# Patient Record
Sex: Female | Born: 1985
Health system: Southern US, Community
[De-identification: ages and names within clinical notes are randomized; demographics above are authoritative.]

## PROBLEM LIST (undated history)

## (undated) DIAGNOSIS — K649 Unspecified hemorrhoids: Secondary | ICD-10-CM

## (undated) DIAGNOSIS — K30 Functional dyspepsia: Secondary | ICD-10-CM

## (undated) DIAGNOSIS — E221 Hyperprolactinemia: Secondary | ICD-10-CM

## (undated) DIAGNOSIS — R519 Headache, unspecified: Secondary | ICD-10-CM

## (undated) DIAGNOSIS — N83209 Unspecified ovarian cyst, unspecified side: Secondary | ICD-10-CM

## (undated) DIAGNOSIS — R002 Palpitations: Secondary | ICD-10-CM

## (undated) DIAGNOSIS — I499 Cardiac arrhythmia, unspecified: Secondary | ICD-10-CM

## (undated) DIAGNOSIS — E282 Polycystic ovarian syndrome: Secondary | ICD-10-CM

## (undated) DIAGNOSIS — R51 Headache: Secondary | ICD-10-CM

## (undated) DIAGNOSIS — D352 Benign neoplasm of pituitary gland: Secondary | ICD-10-CM

## (undated) DIAGNOSIS — F419 Anxiety disorder, unspecified: Secondary | ICD-10-CM

## (undated) HISTORY — DX: Unspecified ovarian cyst, unspecified side: N83.209

## (undated) HISTORY — DX: Polycystic ovarian syndrome: E28.2

## (undated) HISTORY — DX: Benign neoplasm of pituitary gland: D35.2

## (undated) HISTORY — DX: Headache, unspecified: R51.9

## (undated) HISTORY — PX: NO PAST SURGERIES: SHX2092

## (undated) HISTORY — DX: Hyperprolactinemia: E22.1

## (undated) HISTORY — DX: Unspecified hemorrhoids: K64.9

## (undated) HISTORY — DX: Cardiac arrhythmia, unspecified: I49.9

## (undated) HISTORY — DX: Anxiety disorder, unspecified: F41.9

## (undated) HISTORY — DX: Functional dyspepsia: K30

## (undated) HISTORY — DX: Headache: R51

## (undated) HISTORY — DX: Palpitations: R00.2

---
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
## 2007-07-15 ENCOUNTER — Emergency Department: Payer: Self-pay | Admitting: Emergency Medicine

## 2011-04-09 ENCOUNTER — Ambulatory Visit: Payer: Self-pay

## 2011-07-03 ENCOUNTER — Ambulatory Visit: Payer: Self-pay

## 2012-06-16 ENCOUNTER — Ambulatory Visit: Payer: Self-pay

## 2014-04-05 DIAGNOSIS — D352 Benign neoplasm of pituitary gland: Secondary | ICD-10-CM | POA: Insufficient documentation

## 2014-04-05 DIAGNOSIS — N915 Oligomenorrhea, unspecified: Secondary | ICD-10-CM | POA: Insufficient documentation

## 2015-07-14 ENCOUNTER — Ambulatory Visit (INDEPENDENT_AMBULATORY_CARE_PROVIDER_SITE_OTHER): Payer: Self-pay

## 2015-07-14 DIAGNOSIS — Z23 Encounter for immunization: Secondary | ICD-10-CM

## 2016-04-22 ENCOUNTER — Encounter: Payer: Self-pay | Admitting: *Deleted

## 2016-04-22 ENCOUNTER — Telehealth: Payer: Self-pay | Admitting: Cardiology

## 2016-04-22 ENCOUNTER — Emergency Department
Admission: EM | Admit: 2016-04-22 | Discharge: 2016-04-22 | Disposition: A | Discharge status: Home / Self Care | Payer: Self-pay | Attending: Emergency Medicine | Admitting: Emergency Medicine

## 2016-04-22 DIAGNOSIS — Z79899 Other long term (current) drug therapy: Secondary | ICD-10-CM | POA: Insufficient documentation

## 2016-04-22 DIAGNOSIS — R002 Palpitations: Secondary | ICD-10-CM | POA: Insufficient documentation

## 2016-04-22 LAB — CBC
HCT: 39.9 % (ref 35.0–47.0)
Hemoglobin: 14 g/dL (ref 12.0–16.0)
MCH: 31.3 pg (ref 26.0–34.0)
MCHC: 35.1 g/dL (ref 32.0–36.0)
MCV: 89 fL (ref 80.0–100.0)
PLATELETS: 145 10*3/uL — AB (ref 150–440)
RBC: 4.48 MIL/uL (ref 3.80–5.20)
RDW: 12.9 % (ref 11.5–14.5)
WBC: 7.3 10*3/uL (ref 3.6–11.0)

## 2016-04-22 LAB — COMPREHENSIVE METABOLIC PANEL
ALK PHOS: 37 U/L — AB (ref 38–126)
ALT: 11 U/L — ABNORMAL LOW (ref 14–54)
ANION GAP: 8 (ref 5–15)
AST: 16 U/L (ref 15–41)
Albumin: 4.2 g/dL (ref 3.5–5.0)
BUN: 12 mg/dL (ref 6–20)
CALCIUM: 9.2 mg/dL (ref 8.9–10.3)
CHLORIDE: 105 mmol/L (ref 101–111)
CO2: 23 mmol/L (ref 22–32)
CREATININE: 0.67 mg/dL (ref 0.44–1.00)
Glucose, Bld: 97 mg/dL (ref 65–99)
Potassium: 3.5 mmol/L (ref 3.5–5.1)
SODIUM: 136 mmol/L (ref 135–145)
Total Bilirubin: 0.9 mg/dL (ref 0.3–1.2)
Total Protein: 7.2 g/dL (ref 6.5–8.1)

## 2016-04-22 LAB — TSH: TSH: 1.475 u[IU]/mL (ref 0.350–4.500)

## 2016-04-22 LAB — TROPONIN I

## 2016-04-22 MED ORDER — SODIUM CHLORIDE 0.9 % IV SOLN
1000.0000 mL | Freq: Once | INTRAVENOUS | Status: AC
  Administered 2016-04-22: 1000 mL via INTRAVENOUS

## 2016-04-22 NOTE — Telephone Encounter (Signed)
Called patient to make ED fu from 04/22/16  Pt is coming 05/22/16 to see Dr Ellyn Hack.  Pt is going on cruise 05/05/16-05/11/16

## 2016-04-22 NOTE — ED Provider Notes (Signed)
[facility] Emergency Department Provider Note   ____________________________________________    I have reviewed the triage vital signs and the nursing notes.   HISTORY  Chief Complaint Palpitations   HPI Sherry Park is a 30 y.o. female who presents with complaints of fluttering in her chest. She denies chest pain but reports sometimes she feels her heart going rapidly. She denies recent travel. No calf pain or swelling. No shortness of breath. No pleurisy. She is treated by endocrinology for a prolactinoma for which she takes Dostinex. She denies fevers or chills. No rash. She has felt this way intermittently over the last month.   Past Medical History  Diagnosis Date  . Pituitary tumor ([place])     There are no active problems to display for this patient.   History reviewed. No pertinent past surgical history.  Current Outpatient Rx  Name  Route  Sig  Dispense  Refill  . cabergoline (DOSTINEX) 0.5 MG tablet   Oral   Take 1 tablet by mouth 2 (two) times a week.           Allergies Review of patient's allergies indicates no known allergies.  History reviewed. No pertinent family history.  Social History Social History  Substance Use Topics  . Smoking status: Never Smoker   . Smokeless tobacco: None  . Alcohol Use: None    Review of Systems  Constitutional: No fever/chills Eyes: No visual changes.  ENT: No sore throat. Cardiovascular: Denies chest pain. Palpitations as above Respiratory: Denies shortness of breath. Gastrointestinal: No abdominal pain.   Genitourinary: Negative for dysuria. Musculoskeletal: Negative for back pain. Skin: Negative for rash. Neurological: Negative for headaches or weakness  10-point ROS otherwise negative.  ____________________________________________   PHYSICAL EXAM:  VITAL SIGNS: ED Triage Vitals  Enc Vitals Group     BP 04/22/16 0933 142/92 mmHg     Pulse Rate 04/22/16 0933 118       Resp 04/22/16 0933 18     Temp 04/22/16 0933 98.2 F (36.8 C)     Temp Source 04/22/16 0933 Oral     SpO2 04/22/16 0933 100 %     Weight 04/22/16 0933 146 lb (66.225 kg)     Height 04/22/16 0933 5\' 4"  (1.626 m)     Head Cir --      Peak Flow --      Pain Score --      Pain Loc --      Pain Edu? --      Excl. in [place]? --     Constitutional: Alert and oriented. No acute distress. Pleasant and interactive Eyes: Conjunctivae are normal.  Head: Atraumatic.Normocephalic Nose: No congestion/rhinnorhea. Mouth/Throat: Mucous membranes are moist.  Neck: Painless ROM Cardiovascular: Mild tachycardia, regular rhythm. Grossly normal heart sounds.  No murmurs Good peripheral circulation. Respiratory: Normal respiratory effort.  No retractions. Lungs CTAB. Gastrointestinal: Soft and nontender. No distention.  No CVA tenderness. Genitourinary: deferred Musculoskeletal: No lower extremity tenderness nor edema.  Warm and well perfused Neurologic:  Normal speech and language. No gross focal neurologic deficits are appreciated.  Skin:  Skin is warm, dry and intact. No rash noted. Psychiatric: Mood and affect are normal. Speech and behavior are normal.  ____________________________________________   LABS (all labs ordered are listed, but only abnormal results are displayed)  Labs Reviewed  CBC - Abnormal; Notable for the following:    Platelets 145 (*)    All other components within normal limits  COMPREHENSIVE METABOLIC PANEL - Abnormal; Notable for the following:    ALT 11 (*)    Alkaline Phosphatase 37 (*)    All other components within normal limits  TROPONIN I  TSH   ____________________________________________  EKG  ED ECG REPORT I, Lavonia Drafts, the attending physician, personally viewed and interpreted this ECG.  Date: 04/22/2016 EKG Time: 9:34 AM Rate: 108 Rhythm: Sinus tachycardia QRS Axis: normal Intervals: normal ST/T Wave abnormalities: normal Conduction  Disturbances: none Narrative Interpretation: unremarkable  ____________________________________________  RADIOLOGY  None ____________________________________________   PROCEDURES  Procedure(s) performed: No    Critical Care performed: No ____________________________________________   INITIAL IMPRESSION / ASSESSMENT AND PLAN / ED COURSE  Pertinent labs & imaging results that were available during my care of the patient were reviewed by me and considered in my medical decision making (see chart for details).  Discussed with her endocrinologist, she does not feel Dostinex is responsible for tachycardia. She does note it can cause heart valve issues but the patient has no murmurs.  ----------------------------------------- 1:31 PM on 04/22/2016 -----------------------------------------  Patient's heart rate has been normal for some time. Thus far her lab work is unremarkable. We are awaiting TSH ____________________________________________ TSH is normal. Patient's heart rate is normal. She is provided for discharge with cardiology follow-up. Return precautions discussed  FINAL CLINICAL IMPRESSION(S) / ED DIAGNOSES  Final diagnoses:  Palpitations      NEW MEDICATIONS STARTED DURING THIS VISIT:  Discharge Medication List as of 04/22/2016  1:46 PM       Note:  This document was prepared using Dragon voice recognition software and may include unintentional dictation errors.    Lavonia Drafts, MD 04/22/16 1435

## 2016-04-22 NOTE — ED Notes (Addendum)
States she feels as if her heart is fluttering since Thursday, denies any chest pain or SOB, denies any cardiac hx, pt is currently on Cabergoline  for tx of pitutary tumor

## 2016-04-22 NOTE — Discharge Instructions (Signed)

## 2016-04-23 ENCOUNTER — Ambulatory Visit: Payer: Self-pay | Admitting: Unknown Physician Specialty

## 2016-04-24 ENCOUNTER — Ambulatory Visit (INDEPENDENT_AMBULATORY_CARE_PROVIDER_SITE_OTHER): Payer: Self-pay | Admitting: Physician Assistant

## 2016-04-24 ENCOUNTER — Encounter: Payer: Self-pay | Admitting: Physician Assistant

## 2016-04-24 VITALS — BP 122/78 | HR 98 | Ht 64.0 in | Wt 144.5 lb

## 2016-04-24 DIAGNOSIS — Z5181 Encounter for therapeutic drug level monitoring: Secondary | ICD-10-CM

## 2016-04-24 DIAGNOSIS — F419 Anxiety disorder, unspecified: Secondary | ICD-10-CM

## 2016-04-24 DIAGNOSIS — D352 Benign neoplasm of pituitary gland: Secondary | ICD-10-CM

## 2016-04-24 DIAGNOSIS — R002 Palpitations: Secondary | ICD-10-CM

## 2016-04-24 NOTE — Progress Notes (Signed)
Cardiology Office Note Date:  04/24/2016  Patient ID:  Sherry Park 10-Aug-1986, MRN [id number] PCP:  Kathrine Haddock, NP  Cardiologist:  New to [facility] - Dr. Fletcher Anon, MD    Chief Complaint: ED follow up for palpitations   History of Present Illness: Sherry Park is a 30 y.o. female with history of microprolactinoma followed by endocrinology on Dostinex with most recent prolactin level found to be suppressed, prior episode of palpitations in 2005, and anxiety who presents for ED follow up of palpitations. She does not have prior known cardiac history who presented to the [facility] ED on 04/22/16 with complaints of worsening palpitaitons since 04/18/16. She was previously seen in 2005 for palpitations, though note is not on file in Epic and there is no data to review from that time. She denied any chest pain or SOB. Work up in the ED showed a normal TSH, unremarkable CBC outside of PLT 145, troponin negative x 1, SCr 0.67, K+ 3.5. No imaging was performed in the ED. EKG showed sinus tachycardia, 108 bpm, no acute st/t changes. Never a smoker. Case with discussed with endocrinology in the ED who felt her Dostinex was not an etiology of her sinus tachycardia (tachycardia is not a known adverse effect of Dostinex). Her heart rate improved to "normal" in the ED. Her hypokalemia was not corrected in the ED. Of note, she noted dizziness of uncertain etiology at her last endocrinology visit in May 2017, with the consideration of changing from cabergoline to bromocriptine and a possible repeat MRI.    She comes in with complaint of intermittent palpitations over the past 6 months that only occur at rest and are worse at nighttime when she is trying to fall asleep. Since 04/18/16 she has noticed multiple episodes daily, that too only occur at rest. Sometimes they are worse after eating. She reports when she starts to think about her palpitations she will become anxious, then the palpitations will appear. She  states if she is not anxious she will not have any sensation of her heart beat. She previously drank 1 cup of coffee and 2 black teas daily, though has cut out all caffeine since 04/18/16. She denies any illegal drug abuse ever. She has never been a smoker and denies alcohol use. She has not had any recent illness, including N/V/D. She reports when she eats bananas she does not notice the palpitations. She has never had an echo and no prior stress test, cardiac cath, or heart monitor. She currently reports a "fast heart beat." It was noted she had a heart rate of 98 bpm at that time, in normal sinus rhythm without any evidence of PACs, PVCs, or arrhythmia.     Past Medical History  Diagnosis Date  . Microprolactinoma ([place])     a. on Dostinex; b. followed by Dr. Gabriel Carina, MD  . Headache     a. felt to be 2/2 Dostinex    History reviewed. No pertinent past surgical history.  Current Outpatient Prescriptions  Medication Sig Dispense Refill  . cabergoline (DOSTINEX) 0.5 MG tablet Take 1 tablet by mouth 2 (two) times a week.    . norethindrone (MICRONOR,CAMILA,ERRIN) 0.35 MG tablet Take 1 tablet by mouth daily.      No current facility-administered medications for this visit.    Allergies:   Review of patient's allergies indicates no known allergies.   Social History:  The patient  reports that she has never smoked. She does not have any  smokeless tobacco history on file. She reports that she does not drink alcohol or use illicit drugs.   Family History:  The patient's family history includes Diabetes Mellitus II in her mother; Hypertension in her mother; Stroke in her mother.  ROS:   Review of Systems  Constitutional: Positive for malaise/fatigue. Negative for fever, chills, weight loss and diaphoresis.  HENT: Negative for congestion.   Eyes: Negative for discharge and redness.  Respiratory: Negative for cough, sputum production, shortness of breath and wheezing.   Cardiovascular: Positive  for palpitations. Negative for chest pain, orthopnea, claudication, leg swelling and PND.  Gastrointestinal: Negative for heartburn, nausea, vomiting, abdominal pain and diarrhea.  Musculoskeletal: Negative for myalgias and falls.  Skin: Negative for rash.  Neurological: Negative for dizziness, tingling, tremors, sensory change, speech change, focal weakness, loss of consciousness and weakness.  Endo/Heme/Allergies: Does not bruise/bleed easily.  Psychiatric/Behavioral: Negative for substance abuse. The patient is nervous/anxious.   All other systems reviewed and are negative.    PHYSICAL EXAM:  VS:  BP 122/78 mmHg  Pulse 98  Ht 5\' 4"  (1.626 m)  Wt 144 lb 8 oz (65.545 kg)  BMI 24.79 kg/m2  LMP 03/19/2016 BMI: Body mass index is 24.79 kg/(m^2).  Physical Exam  Constitutional: She is oriented to person, place, and time. She appears well-developed and well-nourished.  HENT:  Head: Normocephalic and atraumatic.  Eyes: Right eye exhibits no discharge. Left eye exhibits no discharge.  Neck: Normal range of motion. No JVD present.  Cardiovascular: Normal rate, regular rhythm, S1 normal, S2 normal and normal heart sounds.  Exam reveals no distant heart sounds, no friction rub, no midsystolic click and no opening snap.   No murmur heard. Pulmonary/Chest: Effort normal and breath sounds normal. No respiratory distress. She has no decreased breath sounds. She has no wheezes. She has no rales. She exhibits no tenderness.  Abdominal: Soft. She exhibits no distension. There is no tenderness.  Musculoskeletal: She exhibits no edema.  Neurological: She is alert and oriented to person, place, and time.  Skin: Skin is warm and dry. No cyanosis. Nails show no clubbing.  Psychiatric: She has a normal mood and affect. Her speech is normal and behavior is normal. Judgment and thought content normal.     EKG:  Was ordered and interpreted by me today. Shows NSR, 98 bpm, mildly prolonged QTc of 480 msec,  no acute st/t changes  Recent Labs: 04/22/2016: ALT 11*; BUN 12; Creatinine, Ser 0.67; Hemoglobin 14.0; Platelets 145*; Potassium 3.5; Sodium 136; TSH 1.475  No results found for requested labs within last 365 days.   Estimated Creatinine Clearance: 90.4 mL/min (by C-G formula based on Cr of 0.67).   Wt Readings from Last 3 Encounters:  04/24/16 144 lb 8 oz (65.545 kg)  04/22/16 146 lb (66.225 kg)     Other studies reviewed: Additional studies/records reviewed today include: summarized above  ASSESSMENT AND PLAN:  1. Palpitations: Only occuring in the setting of rest, never with exertion. Worse at nighttime and when anxious. She was noted to be hypokalemic in the ED at 3.5. Perhaps her hypokalemia is playing a role in her symptoms, though suspect there is a strong component of anxiety in play with her symptoms. She self admits anxiety as well. Will recheck a bmet and check a magnesium today. If her potassium remains low will provide low-dose KCl with a recheck bmet in 1 week. She will wear a 48-hour Holter monitor. Her symptoms are frequent enough that she should  not need a longer monitor to evaluate for arrhythmia or ectopy. She is noted to still be symptomatic in the office today with a heart rate in the mid to upper 90's bpm in sinus rhythm. Given this along with her anxiety, she may benefit from a low-dose beta blocker pending the above evaluation.   2. Microprolactinoma: On Dostinex for several years. No prior baseline echo at treatment initiation. Check echo. If she is to remain on the medication for long term she will require periodic echocardiograms every 6-12 months per guidelines. Management of her prolactinoma per endocrinology.  3. Anxiety: Possible beta blocker as above. She may benefit from further treatment per PCP with possible SSRI.    Disposition: F/u with myself or Dr. Fletcher Anon, MD in 2 weeks  Current medicines are reviewed at length with the patient today.  The patient did  not have any concerns regarding medicines.  Melvern Banker PA-C 04/24/2016 4:21 PM     [address] [place], [place] [address] [phone]

## 2016-04-24 NOTE — Patient Instructions (Addendum)
Medication Instructions:  Your physician recommends that you continue on your current medications as directed. Please refer to the Current Medication list given to you today.   Labwork: BMET, mag  Testing/Procedures: Your physician has requested that you have an echocardiogram. Echocardiography is a painless test that uses sound waves to create images of your heart. It provides your doctor with information about the size and shape of your heart and how well your heart's chambers and valves are working. This procedure takes approximately one hour. There are no restrictions for this procedure.  Your physician has recommended that you wear a holter monitor. Holter monitors are medical devices that record the heart's electrical activity. Doctors most often use these monitors to diagnose arrhythmias. Arrhythmias are problems with the speed or rhythm of the heartbeat. The monitor is a small, portable device. You can wear one while you do your normal daily activities. This is usually used to diagnose what is causing palpitations/syncope (passing out).    Follow-Up: Your physician recommends that you schedule a follow-up appointment prior to July 30.    Any Other Special Instructions Will Be Listed Below (If Applicable).     If you need a refill on your cardiac medications before your next appointment, please call your pharmacy.  Echocardiogram An echocardiogram, or echocardiography, uses sound waves (ultrasound) to produce an image of your heart. The echocardiogram is simple, painless, obtained within a short period of time, and offers valuable information to your health care provider. The images from an echocardiogram can provide information such as:  Evidence of coronary artery disease (CAD).  Heart size.  Heart muscle function.  Heart valve function.  Aneurysm detection.  Evidence of a past heart attack.  Fluid buildup around the heart.  Heart muscle thickening.  Assess heart  valve function. LET [facility] CARE PROVIDER KNOW ABOUT:  Any allergies you have.  All medicines you are taking, including vitamins, herbs, eye drops, creams, and over-the-counter medicines.  Previous problems you or members of your family have had with the use of anesthetics.  Any blood disorders you have.  Previous surgeries you have had.  Medical conditions you have.  Possibility of pregnancy, if this applies. BEFORE THE PROCEDURE  No special preparation is needed. Eat and drink normally.  PROCEDURE   In order to produce an image of your heart, gel will be applied to your chest and a wand-like tool (transducer) will be moved over your chest. The gel will help transmit the sound waves from the transducer. The sound waves will harmlessly bounce off your heart to allow the heart images to be captured in real-time motion. These images will then be recorded.  You may need an IV to receive a medicine that improves the quality of the pictures. AFTER THE PROCEDURE You may return to your normal schedule including diet, activities, and medicines, unless your health care provider tells you otherwise.   This information is not intended to replace advice given to you by your health care provider. Make sure you discuss any questions you have with your health care provider.   Document Released: 09/20/2000 Document Revised: 10/14/2014 Document Reviewed: 05/31/2013 Elsevier Interactive Patient Education 2016 Elsevier Inc. Holter Monitoring A Holter monitor is a small device that is used to detect abnormal heart rhythms. It clips to your clothing and is connected by wires to flat, sticky disks (electrodes) that attach to your chest. It is worn continuously for 24-48 hours. HOME CARE INSTRUCTIONS  Wear your Holter monitor at all  times, even while exercising and sleeping, for as long as directed by your health care provider.  Make sure that the Holter monitor is safely clipped to your clothing  or close to your body as recommended by your health care provider.  Do not get the monitor or wires wet.  Do not put body lotion or moisturizer on your chest.  Keep your skin clean.  Keep a diary of your daily activities, such as walking and doing chores. If you feel that your heartbeat is abnormal or that your heart is fluttering or skipping a beat:  Record what you are doing when it happens.  Record what time of day the symptoms occur.  Return your Holter monitor as directed by your health care provider.  Keep all follow-up visits as directed by your health care provider. This is important. SEEK IMMEDIATE MEDICAL CARE IF:  You feel lightheaded or you faint.  You have trouble breathing.  You feel pain in your chest, upper arm, or jaw.  You feel sick to your stomach and your skin is pale, cool, or damp.  You heartbeat feels unusual or abnormal.   This information is not intended to replace advice given to you by your health care provider. Make sure you discuss any questions you have with your health care provider.   Document Released: 06/21/2004 Document Revised: 10/14/2014 Document Reviewed: 05/02/2014 Elsevier Interactive Patient Education [employer].

## 2016-04-25 LAB — BASIC METABOLIC PANEL
BUN / CREAT RATIO: 12 (ref 9–23)
BUN: 8 mg/dL (ref 6–20)
CO2: 21 mmol/L (ref 18–29)
Calcium: 9.8 mg/dL (ref 8.7–10.2)
Chloride: 104 mmol/L (ref 96–106)
Creatinine, Ser: 0.68 mg/dL (ref 0.57–1.00)
GFR calc non Af Amer: 119 mL/min/{1.73_m2} (ref >59)
GFR, EST AFRICAN AMERICAN: 138 mL/min/{1.73_m2} (ref >59)
GLUCOSE: 84 mg/dL (ref 65–99)
Potassium: 3.7 mmol/L (ref 3.5–5.2)
Sodium: 144 mmol/L (ref 134–144)

## 2016-04-25 LAB — MAGNESIUM: Magnesium: 2.1 mg/dL (ref 1.6–2.3)

## 2016-04-29 ENCOUNTER — Ambulatory Visit (INDEPENDENT_AMBULATORY_CARE_PROVIDER_SITE_OTHER): Payer: Self-pay

## 2016-04-29 ENCOUNTER — Other Ambulatory Visit: Payer: Self-pay

## 2016-04-29 DIAGNOSIS — Z5181 Encounter for therapeutic drug level monitoring: Secondary | ICD-10-CM

## 2016-04-29 DIAGNOSIS — R002 Palpitations: Secondary | ICD-10-CM

## 2016-05-03 ENCOUNTER — Ambulatory Visit (INDEPENDENT_AMBULATORY_CARE_PROVIDER_SITE_OTHER): Payer: Self-pay | Admitting: Cardiovascular Disease

## 2016-05-03 ENCOUNTER — Encounter: Payer: Self-pay | Admitting: Cardiovascular Disease

## 2016-05-03 VITALS — BP 120/80 | HR 102 | Ht 64.0 in | Wt 139.5 lb

## 2016-05-03 DIAGNOSIS — R42 Dizziness and giddiness: Secondary | ICD-10-CM

## 2016-05-03 DIAGNOSIS — R002 Palpitations: Secondary | ICD-10-CM

## 2016-05-03 NOTE — Patient Instructions (Signed)
Medication Instructions:  Your physician recommends that you continue on your current medications as directed. Please refer to the Current Medication list given to you today.   Labwork: none  Testing/Procedures: none  Follow-Up: Your physician wants you to follow-up in: 3 months with Christell Faith, PA-C You will receive a reminder letter in the mail two months in advance. If you don't receive a letter, please call our office to schedule the follow-up appointment.   Any Other Special Instructions Will Be Listed Below (If Applicable).     If you need a refill on your cardiac medications before your next appointment, please call your pharmacy.

## 2016-05-03 NOTE — Progress Notes (Signed)
Cardiology Office Note   Date:  05/03/2016   ID:  Sherry Park, DOB 03-31-1986, MRN [id number]  PCP:  Kathrine Haddock, NP  Cardiologist:   Kathlyn Sacramento, MD   Chief Complaint  Patient presents with  . Other    Follow up from echo. Meds reviewed by the patient verbally. Pt. c/o more frequent palpitations than before.       History of Present Illness: Sherry Park is a 30 y.o. female who was referred by Dr. Corky Downs  from [facility] ED for evaluation of palpitations. She is treated by endocrinology for a prolactinoma for which she takes Dostinex.  workup in the emergency department showed normal labs overall including magnesium and TSH. Troponin was normal. EKG showed sinus tachycardia.  she underwent an echocardiogram which was overall normal. She was asked to increase her potassium intake given that her platelet level was borderline at 3.5. She has been eating a banana every day and reports improvement in symptoms. She complains mainly of skipped beats at rest when she is trying to sleep or after she eats. She has no exertional palpitations. She has no family history of coronary artery disease, arrhythmia or sudden death.  Past Medical History:  Diagnosis Date  . Headache    a. felt to be 2/2 Dostinex  . Microprolactinoma ([place])    a. on Dostinex; b. followed by Dr. Gabriel Carina, MD    History reviewed. No pertinent surgical history.   Current Outpatient Prescriptions  Medication Sig Dispense Refill  . cabergoline (DOSTINEX) 0.5 MG tablet Take 1 tablet by mouth 2 (two) times a week.    . norethindrone (MICRONOR,CAMILA,ERRIN) 0.35 MG tablet Take 1 tablet by mouth daily.      No current facility-administered medications for this visit.     Allergies:   Review of patient's allergies indicates no known allergies.    Social History:  The patient  reports that she has never smoked. She has never used smokeless tobacco. She reports that she does not drink alcohol or use drugs.    Family History:  The patient's family history includes Diabetes Mellitus II in her mother; Hypertension in her mother; Stroke in her mother.    ROS:  Please see the history of present illness.   Otherwise, review of systems are positive for none.   All other systems are reviewed and negative.    PHYSICAL EXAM: VS:  BP 120/80 (BP Location: Left Arm, Patient Position: Sitting, Cuff Size: Normal)   Pulse (!) 102   Ht 5\' 4"  (1.626 m)   Wt 139 lb 8 oz (63.3 kg)   LMP 03/19/2016   BMI 23.95 kg/m  , BMI Body mass index is 23.95 kg/m. GEN: Well nourished, well developed, in no acute distress  HEENT: normal  Neck: no JVD, carotid bruits, or masses Cardiac: RRR; no murmurs, rubs, or gallops,no edema  Respiratory:  clear to auscultation bilaterally, normal work of breathing GI: soft, nontender, nondistended, + BS MS: no deformity or atrophy  Skin: warm and dry, no rash Neuro:  Strength and sensation are intact Psych: euthymic mood, full affect   EKG:  EKG is ordered today. The ekg ordered today demonstrates normal sinus rhythm with no significant ST or T wave changes. Normal PR and QT intervals.   Recent Labs: 04/22/2016: ALT 11; Hemoglobin 14.0; Platelets 145; TSH 1.475 04/24/2016: BUN 8; Creatinine, Ser 0.68; Magnesium 2.1; Potassium 3.7; Sodium 144    Lipid Panel No results found for: CHOL, TRIG, HDL,  CHOLHDL, VLDL, LDLCALC, LDLDIRECT    Wt Readings from Last 3 Encounters:  05/03/16 139 lb 8 oz (63.3 kg)  04/24/16 144 lb 8 oz (65.5 kg)  04/22/16 146 lb (66.2 kg)      ASSESSMENT AND PLAN:  1.  Palpitations: The history is suggestive of premature beats. Echocardiogram was essentially normal and current EKG also does not show any abnormalities. I reassured her that the chance of malignant arrhythmias extremely low. Symptoms improved with increased potassium intake. She also cut down on caffeine intake. A Holter monitor was done but this has not been processed yet and I could  not treated. She tends to have sinus tachycardia and some of this might be triggered by stress and anxiety. I explained to them that treatment with a beta blocker can be considered as a last resort for symptomatic relief.    Disposition:   FU with Ryan in 3 months.   Signed,  Kathlyn Sacramento, MD  05/03/2016 3:36 PM    [place]

## 2016-05-08 ENCOUNTER — Ambulatory Visit
Admission: RE | Admit: 2016-05-08 | Discharge: 2016-05-08 | Disposition: A | Discharge status: Home / Self Care | Payer: Self-pay | Source: Ambulatory Visit | Attending: Physician Assistant | Admitting: Physician Assistant

## 2016-05-08 DIAGNOSIS — F410 Panic disorder [episodic paroxysmal anxiety] without agoraphobia: Secondary | ICD-10-CM | POA: Insufficient documentation

## 2016-05-08 DIAGNOSIS — I491 Atrial premature depolarization: Secondary | ICD-10-CM | POA: Insufficient documentation

## 2016-05-08 DIAGNOSIS — R002 Palpitations: Secondary | ICD-10-CM | POA: Insufficient documentation

## 2016-05-08 DIAGNOSIS — R000 Tachycardia, unspecified: Secondary | ICD-10-CM | POA: Insufficient documentation

## 2016-05-13 ENCOUNTER — Telehealth: Payer: Self-pay | Admitting: Cardiovascular Disease

## 2016-05-13 NOTE — Telephone Encounter (Signed)
Left message to call back  

## 2016-05-13 NOTE — Telephone Encounter (Signed)
Pt would like holter results. 

## 2016-05-14 NOTE — Telephone Encounter (Signed)
Per verbal from Dr. Fletcher Anon, HM shows occasional PACs, one episode sinus tach. Symptoms during this episode included "anxiety and a panic attack". Nothing serious. Continue to monitor.  Reviewed results w/pt who verbalized understanding and is appreciative of the call.

## 2016-05-15 ENCOUNTER — Telehealth: Payer: Self-pay | Admitting: Cardiovascular Disease

## 2016-05-15 ENCOUNTER — Ambulatory Visit: Payer: Self-pay | Admitting: Cardiology

## 2016-05-15 NOTE — Telephone Encounter (Signed)
Pt wanting to talk to someone since her heart has been skipping She has had a lot more light headedness She spoke to a nurse yesterday about Holter results this is a fu question from that Please advise.

## 2016-05-16 NOTE — Telephone Encounter (Signed)
Left message on machine for patient to contact the office.   

## 2016-05-16 NOTE — Telephone Encounter (Signed)
Patient returning vm.  Please call again.

## 2016-05-17 NOTE — Telephone Encounter (Addendum)
Left message on machine for patient to contact the office.   

## 2016-05-17 NOTE — Telephone Encounter (Signed)
S/w pt who inquires as to possible cause of recent light-headedness. States it sometimes occurs when she stands from sitting position and does not think it is r/t any BP issue as her BP is WNL. Feels it could be r/t stress.  Pt had a recent echo, holter monitor, and labs. No intervention needed. Advised pt to continue to monitor sx and BP and f/u w/PCP. She verbalized understanding and is agreeable w/plan.  She has f/u appt w/Dr. Fletcher Anon in Oct.

## 2016-05-22 ENCOUNTER — Ambulatory Visit: Payer: Self-pay | Admitting: Cardiology

## 2016-05-29 ENCOUNTER — Ambulatory Visit: Payer: Self-pay | Admitting: Unknown Physician Specialty

## 2016-06-06 ENCOUNTER — Encounter: Payer: Self-pay | Admitting: Family Medicine

## 2016-06-06 ENCOUNTER — Ambulatory Visit (INDEPENDENT_AMBULATORY_CARE_PROVIDER_SITE_OTHER): Payer: Self-pay | Admitting: Family Medicine

## 2016-06-06 VITALS — BP 123/81 | HR 82 | Temp 98.2°F | Ht 63.8 in | Wt 140.6 lb

## 2016-06-06 DIAGNOSIS — R42 Dizziness and giddiness: Secondary | ICD-10-CM

## 2016-06-06 DIAGNOSIS — F411 Generalized anxiety disorder: Secondary | ICD-10-CM | POA: Insufficient documentation

## 2016-06-06 MED ORDER — FLUTICASONE PROPIONATE 50 MCG/ACT NA SUSP: 2.0000 | Freq: Every day | NASAL | 6 refills | Status: DC

## 2016-06-06 MED ORDER — MECLIZINE HCL 12.5 MG PO TABS: 12.5000 mg | ORAL_TABLET | Freq: Three times a day (TID) | ORAL | 0 refills | Status: DC | PRN

## 2016-06-06 NOTE — Assessment & Plan Note (Signed)
Likely the primary cause of her feelings of weakness and feelings of being off balance. Score 13 on GAD 7 today, unsure of baseline. Not ready to pursue counseling or medications at this time.

## 2016-06-06 NOTE — Progress Notes (Signed)
BP 123/81 (BP Location: Right Arm, Patient Position: Sitting, Cuff Size: Normal)   Pulse 82   Temp 98.2 F (36.8 C)   Ht 5' 3.8" (1.621 m)   Wt 140 lb 9.6 oz (63.8 kg)   LMP 05/09/2016 (Approximate)   SpO2 99%   BMI 24.29 kg/m    Subjective:    Patient ID: Sherry Park, female    DOB: 1986-05-18, 30 y.o.   MRN: [id number]  HPI: Sherry Park is a 30 y.o. female  Chief Complaint  Patient presents with  . muscle weakness    pt states she has been having muscle weakness for about 2 months  . Anxiety  . balance    pt states she has been off balance for about 2 months now    Approximately 2 month history of generalized weakness and feeling off balance. States she went to the ER recently for palpitations and weakness, workup negative but sent to Cardiology for further eval. Cardiology work-up negative. Hx of microprolactinoma followed by endocrinology - worth mentioning that she mentioned these symptoms at visit in may and there was consideration of switching from cabergoline to bromocriptine and possibly repeating MRI to monitor for changes.  Does have a hx of anxiety that has been under good control until recently per patient. Has not ever taken medication for symptoms and is not interested in taking any at this time. Patient very tearful when discussing her symptoms. Also states she stopped her OCPs recently when she heard that they can cause brain tumors. Does not believe that she could be pregnant.  Denies fever, chills, tinnitus, ear pain, hearing or vision changes, headaches.   GAD 7 : Generalized Anxiety Score 06/06/2016  Nervous, Anxious, on Edge 2  Control/stop worrying 2  Worry too much - different things 2  Trouble relaxing 2  Restless 2  Easily annoyed or irritable 1  Afraid - awful might happen 2  Total GAD 7 Score 13  Anxiety Difficulty Somewhat difficult    Relevant past medical, surgical, family and social history reviewed and updated as indicated. Interim  medical history since our last visit reviewed. Allergies and medications reviewed and updated.  Review of Systems  HENT: Negative.   Respiratory: Negative.   Cardiovascular: Negative.   Gastrointestinal: Positive for nausea.  Musculoskeletal: Negative.   Neurological: Positive for weakness and light-headedness.  Psychiatric/Behavioral: The patient is nervous/anxious.     Per HPI unless specifically indicated above     Objective:    BP 123/81 (BP Location: Right Arm, Patient Position: Sitting, Cuff Size: Normal)   Pulse 82   Temp 98.2 F (36.8 C)   Ht 5' 3.8" (1.621 m)   Wt 140 lb 9.6 oz (63.8 kg)   LMP 05/09/2016 (Approximate)   SpO2 99%   BMI 24.29 kg/m   Wt Readings from Last 3 Encounters:  06/06/16 140 lb 9.6 oz (63.8 kg)  05/03/16 139 lb 8 oz (63.3 kg)  04/24/16 144 lb 8 oz (65.5 kg)    Physical Exam  Constitutional: She is oriented to person, place, and time. She appears well-developed and well-nourished. No distress.  HENT:  Head: Atraumatic.  Eyes: Conjunctivae are normal. Pupils are equal, round, and reactive to light. No scleral icterus.  Neck: Normal range of motion. Neck supple.  Cardiovascular: Normal rate, regular rhythm and normal heart sounds.   Musculoskeletal: Normal range of motion.  Lymphadenopathy:    She has no cervical adenopathy.  Neurological: She is alert and oriented  to person, place, and time. She displays normal reflexes. No cranial nerve deficit. Coordination normal.  Skin: Skin is warm and dry. No rash noted.  Psychiatric: She has a normal mood and affect. Her behavior is normal.  Tearful, shaky and anxious  Nursing note and vitals reviewed.       Assessment & Plan:   Problem List Items Addressed This Visit      Other   Generalized anxiety disorder    Likely the primary cause of her feelings of weakness and feelings of being off balance. Score 13 on GAD 7 today, unsure of baseline. Not ready to pursue counseling or medications at  this time.        Other Visit Diagnoses    Dizziness    -  Primary   All previous work-ups negative. Will send flonase and meclizine in case any vertigo/inner ear component but likely largely due to her anxiety acutely worsening   Relevant Orders   CBC with Differential/Platelet   Comprehensive metabolic panel   UA/M w/rflx Culture, Routine (Completed)   VITAMIN D 25 Hydroxy (Vit-D Deficiency, Fractures)   B12   Pregnancy, urine    Will await lab results. She will follow up if no resolution on new regimen.    Follow up plan: Return if symptoms worsen or fail to improve.

## 2016-06-07 ENCOUNTER — Encounter: Payer: Self-pay | Admitting: Family Medicine

## 2016-06-07 LAB — UA/M W/RFLX CULTURE, ROUTINE
BILIRUBIN UA: NEGATIVE
GLUCOSE, UA: NEGATIVE
KETONES UA: NEGATIVE
Nitrite, UA: NEGATIVE
PROTEIN UA: NEGATIVE
SPEC GRAV UA: 1.01 (ref 1.005–1.030)
Urobilinogen, Ur: 0.2 mg/dL (ref 0.2–1.0)
pH, UA: 7 (ref 5.0–7.5)

## 2016-06-07 LAB — COMPREHENSIVE METABOLIC PANEL
A/G RATIO: 1.7 (ref 1.2–2.2)
ALBUMIN: 4.6 g/dL (ref 3.5–5.5)
ALK PHOS: 43 IU/L (ref 39–117)
ALT: 8 IU/L (ref 0–32)
AST: 11 IU/L (ref 0–40)
BILIRUBIN TOTAL: 0.6 mg/dL (ref 0.0–1.2)
BUN / CREAT RATIO: 13 (ref 9–23)
BUN: 9 mg/dL (ref 6–20)
CHLORIDE: 101 mmol/L (ref 96–106)
CO2: 21 mmol/L (ref 18–29)
Calcium: 9.6 mg/dL (ref 8.7–10.2)
Creatinine, Ser: 0.68 mg/dL (ref 0.57–1.00)
GFR calc Af Amer: 138 mL/min/{1.73_m2} (ref >59)
GFR calc non Af Amer: 119 mL/min/{1.73_m2} (ref >59)
GLOBULIN, TOTAL: 2.7 g/dL (ref 1.5–4.5)
Glucose: 96 mg/dL (ref 65–99)
POTASSIUM: 3.9 mmol/L (ref 3.5–5.2)
SODIUM: 141 mmol/L (ref 134–144)
Total Protein: 7.3 g/dL (ref 6.0–8.5)

## 2016-06-07 LAB — CBC WITH DIFFERENTIAL/PLATELET
Basophils Absolute: 0 10*3/uL (ref 0.0–0.2)
Basos: 1 %
EOS (ABSOLUTE): 0.1 10*3/uL (ref 0.0–0.4)
EOS: 1 %
HEMATOCRIT: 42 % (ref 34.0–46.6)
HEMOGLOBIN: 13.8 g/dL (ref 11.1–15.9)
Immature Grans (Abs): 0 10*3/uL (ref 0.0–0.1)
Immature Granulocytes: 0 %
LYMPHS ABS: 1.2 10*3/uL (ref 0.7–3.1)
Lymphs: 23 %
MCH: 30.5 pg (ref 26.6–33.0)
MCHC: 32.9 g/dL (ref 31.5–35.7)
MCV: 93 fL (ref 79–97)
MONOCYTES: 6 %
MONOS ABS: 0.4 10*3/uL (ref 0.1–0.9)
NEUTROS ABS: 3.7 10*3/uL (ref 1.4–7.0)
Neutrophils: 69 %
Platelets: 172 10*3/uL (ref 150–379)
RBC: 4.53 x10E6/uL (ref 3.77–5.28)
RDW: 12.9 % (ref 12.3–15.4)
WBC: 5.4 10*3/uL (ref 3.4–10.8)

## 2016-06-07 LAB — MICROSCOPIC EXAMINATION

## 2016-06-07 LAB — VITAMIN D 25 HYDROXY (VIT D DEFICIENCY, FRACTURES): Vit D, 25-Hydroxy: 26.4 ng/mL — ABNORMAL LOW (ref 30.0–100.0)

## 2016-06-07 LAB — URINE CULTURE, REFLEX

## 2016-06-07 LAB — VITAMIN B12: VITAMIN B 12: 275 pg/mL (ref 211–946)

## 2016-06-07 LAB — PREGNANCY, URINE: PREG TEST UR: NEGATIVE

## 2016-06-20 ENCOUNTER — Other Ambulatory Visit: Payer: Self-pay | Admitting: Obstetrics and Gynecology

## 2016-06-20 DIAGNOSIS — N6002 Solitary cyst of left breast: Secondary | ICD-10-CM

## 2016-06-24 ENCOUNTER — Ambulatory Visit: Payer: Self-pay | Attending: Oncology

## 2016-06-24 ENCOUNTER — Encounter: Payer: Self-pay | Admitting: *Deleted

## 2016-06-24 VITALS — BP 146/90 | HR 123 | Temp 98.2°F | Ht 63.78 in | Wt 140.8 lb

## 2016-06-24 DIAGNOSIS — N63 Unspecified lump in unspecified breast: Secondary | ICD-10-CM

## 2016-06-24 NOTE — Progress Notes (Signed)
Subjective:     Patient ID: Sherry Park, female   DOB: 23-Jan-1986, 30 y.o.   MRN: [id number]  HPI   Review of Systems     Objective:   Physical Exam  Pulmonary/Chest: Right breast exhibits no inverted nipple, no mass, no nipple discharge, no skin change and no tenderness. Left breast exhibits tenderness. Left breast exhibits no inverted nipple, no mass, no nipple discharge and no skin change. Breasts are symmetrical.         Assessment:     30 year old patient presents for [place] clinic visit.  Patient is referred by Sherry Park [facility] GYN for left breast cyst at 6 o'clock.  Patient screened, and meets BCCCP eligibility.  Patient does not have insurance, Medicare or Medicaid.  Handout given on Affordable Care Act.  Instructed patient on breast self-exam using teach back method.  Patient reports she has experienced tenderness first from 3-4 o'clock left breast, but it has moved to 5-6 o'clock left breast.  First noticed a little over 1 month ago.  Fibroglandular tissue palpated bilateral in lower quadrants.  No dominant lump or mass palpated.   Discussed patients vital signs, and discovered she has a sister on ventilator in Critical Care Unit.  Patient tearful.  Offered [facility] sessions to be paid for through [employer].    Plan:     Sherry Park to schedule appointment with [facility] for consult , and possible breast ultrasound.

## 2016-07-03 ENCOUNTER — Ambulatory Visit (INDEPENDENT_AMBULATORY_CARE_PROVIDER_SITE_OTHER): Payer: Self-pay | Admitting: Family Medicine

## 2016-07-03 ENCOUNTER — Encounter: Payer: Self-pay | Admitting: Family Medicine

## 2016-07-03 VITALS — BP 121/86 | HR 115 | Temp 99.1°F | Wt 141.0 lb

## 2016-07-03 DIAGNOSIS — N390 Urinary tract infection, site not specified: Secondary | ICD-10-CM

## 2016-07-03 DIAGNOSIS — N898 Other specified noninflammatory disorders of vagina: Secondary | ICD-10-CM

## 2016-07-03 MED ORDER — SULFAMETHOXAZOLE-TRIMETHOPRIM 800-160 MG PO TABS: 1.0000 | ORAL_TABLET | Freq: Two times a day (BID) | ORAL | 0 refills | Status: DC

## 2016-07-03 NOTE — Patient Instructions (Signed)
Follow up as needed

## 2016-07-03 NOTE — Progress Notes (Signed)
BP 121/86   Pulse (!) 115   Temp 99.1 F (37.3 C)   Wt 141 lb (64 kg)   LMP 05/09/2016 (Approximate)   SpO2 100%   BMI 24.37 kg/m    Subjective:    Patient ID: Sherry Park, female    DOB: [birth year], 30 y.o.   MRN: [id number]  HPI: Sherry Park is a 30 y.o. female  Chief Complaint  Patient presents with  . Urinary Tract Infection    She had a transvag u/s done 2 weeks ago and after that she started feeling like she had a UTI and was irritated. Having frequent urination, only can give a dribble, some burning and low back pain. The doctor who did the u/s wondered if it was a latex allergy from the probe.   Patient presents c/o vaginal irritation/burning, dyspareunia, urinary frequency and hesitancy, and some burning when she urinates. Also reports some itching and thin discharge. Notes that she had a transvaginal u/s about 2 weeks ago right before symptom onset. GYN thought it may be a latex allergy, but sxs continue and are slightly worse than before. Denies fever, does have some lbp but that feels like it is from her ovarian cysts. Denies any new sexual partners the past few months, but would like to be checked for GC Chlamydia just in case. Has not tried anything OTC at this time for relief.   Relevant past medical, surgical, family and social history revie,wed and updated as indicated. Interim medical history since our last visit reviewed. Allergies and medications reviewed and updated.  Review of Systems  Constitutional: Negative.   HENT: Negative.   Respiratory: Negative.   Cardiovascular: Negative.   Gastrointestinal: Negative.   Genitourinary: Positive for decreased urine volume, difficulty urinating, dyspareunia, dysuria, frequency, vaginal discharge and vaginal pain.  Musculoskeletal: Negative.   Neurological: Negative.   Psychiatric/Behavioral: Negative.     Per HPI unless specifically indicated above     Objective:    BP 121/86   Pulse (!) 115   Temp  99.1 F (37.3 C)   Wt 141 lb (64 kg)   LMP 05/09/2016 (Approximate)   SpO2 100%   BMI 24.37 kg/m   Wt Readings from Last 3 Encounters:  07/03/16 141 lb (64 kg)  06/24/16 140 lb 12.2 oz (63.9 kg)  06/06/16 140 lb 9.6 oz (63.8 kg)    Physical Exam  Constitutional: She is oriented to person, place, and time. She appears well-developed and well-nourished. No distress.  HENT:  Head: Atraumatic.  Eyes: Conjunctivae are normal. Pupils are equal, round, and reactive to light. No scleral icterus.  Neck: Normal range of motion. Neck supple.  Cardiovascular: Normal rate, regular rhythm and normal heart sounds.   Pulmonary/Chest: Effort normal. No respiratory distress.  Genitourinary: Uterus normal. There is no rash or lesion on the right labia. There is no rash or lesion on the left labia. Cervix exhibits no friability. There is tenderness in the vagina. No erythema or bleeding in the vagina. Vaginal discharge found.  Musculoskeletal: Normal range of motion.  Neurological: She is alert and oriented to person, place, and time.  Skin: Skin is warm and dry.  Psychiatric: She has a normal mood and affect. Her behavior is normal.    Results for orders placed or performed in visit on 07/03/16  WET PREP FOR [place], YEAST, CLUE  Result Value Ref Range   Trichomonas Exam Negative Negative   Yeast Exam Negative Negative   Clue Cell Exam  Negative Negative  Microscopic Examination  Result Value Ref Range   WBC, UA 0-5 0 - 5 /hpf   RBC, UA 3-10 (A) 0 - 2 /hpf   Epithelial Cells (non renal) 0-10 0 - 10 /hpf   Mucus, UA Present Not Estab.   Bacteria, UA Few None seen/Few  UA/M w/rflx Culture, Routine  Result Value Ref Range   Specific Gravity, UA 1.015 1.005 - 1.030   pH, UA 8.5 (H) 5.0 - 7.5   Color, UA Yellow Yellow   Appearance Ur Cloudy (A) Clear   Leukocytes, UA Trace (A) Negative   Protein, UA Trace Negative/Trace   Glucose, UA Negative Negative   Ketones, UA Negative Negative   RBC, UA  2+ (A) Negative   Bilirubin, UA Negative Negative   Urobilinogen, Ur 1.0 0.2 - 1.0 mg/dL   Nitrite, UA Negative Negative   Microscopic Examination See below:    Urinalysis Reflex Comment   Urine Culture, Routine  Result Value Ref Range   Urine Culture, Routine WILL FOLLOW       Assessment & Plan:   Problem List Items Addressed This Visit    None    Visit Diagnoses    UTI (lower urinary tract infection)    -  Primary   Bactrim sent, U/A showed mild infection. Will await cx. Recommended good hydration.   Relevant Medications   sulfamethoxazole-trimethoprim (BACTRIM DS,SEPTRA DS) 800-160 MG tablet   Other Relevant Orders   UA/M w/rflx Culture, Routine (STAT)   GC/Chlamydia Probe Amp   Vaginal discharge       Wet prep negative. Vaginal irritation could be lingering from ultrasound/suspected latex allergy. If no resolution in next week, will have her f/u with GYN   Relevant Orders   WET PREP FOR [place], YEAST, CLUE (Completed)   GC/Chlamydia Probe Amp    Await GC Chlamydia. Patient ok'ed calling number on chart with results.    Follow up plan: Return if symptoms worsen or fail to improve.

## 2016-07-04 LAB — GC/CHLAMYDIA PROBE AMP
CHLAMYDIA, DNA PROBE: NEGATIVE
Neisseria gonorrhoeae by PCR: NEGATIVE

## 2016-07-05 LAB — UA/M W/RFLX CULTURE, ROUTINE
Bilirubin, UA: NEGATIVE
Glucose, UA: NEGATIVE
Ketones, UA: NEGATIVE
Nitrite, UA: NEGATIVE
PH UA: 8.5 — AB (ref 5.0–7.5)
Specific Gravity, UA: 1.015 (ref 1.005–1.030)
Urobilinogen, Ur: 1 mg/dL (ref 0.2–1.0)

## 2016-07-05 LAB — MICROSCOPIC EXAMINATION

## 2016-07-05 LAB — WET PREP FOR TRICH, YEAST, CLUE
CLUE CELL EXAM: NEGATIVE
TRICHOMONAS EXAM: NEGATIVE
YEAST EXAM: NEGATIVE

## 2016-07-05 LAB — URINE CULTURE, REFLEX

## 2016-07-10 ENCOUNTER — Encounter: Payer: Self-pay | Admitting: General Surgery

## 2016-07-10 ENCOUNTER — Inpatient Hospital Stay: Payer: Self-pay

## 2016-07-10 ENCOUNTER — Ambulatory Visit (INDEPENDENT_AMBULATORY_CARE_PROVIDER_SITE_OTHER): Payer: PRIVATE HEALTH INSURANCE | Admitting: General Surgery

## 2016-07-10 ENCOUNTER — Other Ambulatory Visit: Payer: Self-pay

## 2016-07-10 VITALS — BP 118/82 | HR 104 | Resp 14 | Ht 63.0 in | Wt 141.0 lb

## 2016-07-10 DIAGNOSIS — N644 Mastodynia: Secondary | ICD-10-CM

## 2016-07-10 NOTE — Progress Notes (Addendum)
Patient ID: Sherry Park, female   DOB: 1986-09-20, 30 y.o.   MRN: [id number]  Chief Complaint  Patient presents with  . Other    breast pain    HPI Sherry Park is a 30 y.o. female here for evaluation of a left breast cyst. She first noticed this about the middle of August with soreness in the breast. She has discomfort with palpation of the area.Patient stop her birth control pills at the end of August. She states the discomfort has improved. She cannot feel a mass in her breast.   Breast symptoms started with discontinuation of oral contraceptives in August 2017.    HPI  Past Medical History:  Diagnosis Date  . Headache    a. felt to be 2/2 Dostinex  . Hemorrhoids   . Microprolactinoma ([place])    a. on Dostinex; b. followed by Dr. Gabriel Carina, MD  . Ovarian cyst     History reviewed. No pertinent surgical history.  Family History  Problem Relation Age of Onset  . Diabetes Mellitus II Mother   . Hypertension Mother   . Stroke Mother     disabling stroke from oral contraceptive pill use at 30 years of age, no family hx of CVA  . Kidney disease Father     Social History Social History  Substance Use Topics  . Smoking status: Never Smoker  . Smokeless tobacco: Never Used  . Alcohol use No    No Active Allergies  Current Outpatient Prescriptions  Medication Sig Dispense Refill  . cabergoline (DOSTINEX) 0.5 MG tablet Take 0.25 mg by mouth 2 (two) times a week.    . Multiple Vitamin (MULTIVITAMIN) tablet Take 1 tablet by mouth daily.     No current facility-administered medications for this visit.     Review of Systems Review of Systems  Constitutional: Negative.   Respiratory: Negative.   Cardiovascular: Negative.     Blood pressure 118/82, pulse (!) 104, resp. rate 14, height 5\' 3"  (1.6 m), weight 141 lb (64 kg), last menstrual period 07/08/2016.  Physical Exam Physical Exam  Constitutional: She is oriented to person, place, and time. She appears  well-developed and well-nourished.  Eyes: Conjunctivae are normal. No scleral icterus.  Neck: Neck supple.  Cardiovascular: Normal rate, regular rhythm and normal heart sounds.   Pulmonary/Chest: Effort normal and breath sounds normal. Right breast exhibits no inverted nipple, no mass, no nipple discharge, no skin change and no tenderness. Left breast exhibits no inverted nipple, no mass, no nipple discharge, no skin change and no tenderness.    Breast examination showed mild lateral tenderness towards the periphery of the left breast.  No dominant mass or thickening. No fixation. No edema the skin. No axillary adenopathy.  Lymphadenopathy:    She has no cervical adenopathy.    She has no axillary adenopathy.  Neurological: She is alert and oriented to person, place, and time.  Skin: Skin is warm and dry.    Data Review  Limited ultrasound was completed in the area of local discomfort. Images are missed labile. These were completed the 3:00 position of the left breast. 8 cm from the nipple. There is no area of mixed heterogeneous tissue consistent with normal fibroglandular tissue encompassing area of 1 x 3 x 3.2 cm. BI-RADS-2.  Assessment    Benign breast exam.    Plan  The patient was reassured that the recent mild changes in her breast should spontaneously resolve.   Patient to return as needed.  This has been scribed by Gaspar Cola CMA.    Robert Bellow 08/26/2016, 5:21 PM

## 2016-07-10 NOTE — Patient Instructions (Signed)
Return as needed

## 2016-07-18 ENCOUNTER — Ambulatory Visit (INDEPENDENT_AMBULATORY_CARE_PROVIDER_SITE_OTHER): Payer: Self-pay

## 2016-07-18 DIAGNOSIS — Z23 Encounter for immunization: Secondary | ICD-10-CM

## 2016-07-26 ENCOUNTER — Ambulatory Visit: Payer: Self-pay | Admitting: Cardiovascular Disease

## 2016-08-16 ENCOUNTER — Ambulatory Visit: Payer: Self-pay | Admitting: Cardiovascular Disease

## 2016-08-22 NOTE — Progress Notes (Unsigned)
Per Dr. Dwyane Luo note patient had benign findings on clinical exam ,and ultrasound.  Copy to [facility].

## 2016-08-23 ENCOUNTER — Encounter: Payer: Self-pay | Admitting: General Surgery

## 2016-09-13 ENCOUNTER — Ambulatory Visit (INDEPENDENT_AMBULATORY_CARE_PROVIDER_SITE_OTHER): Payer: Self-pay | Admitting: Cardiovascular Disease

## 2016-09-13 ENCOUNTER — Encounter: Payer: Self-pay | Admitting: Cardiovascular Disease

## 2016-09-13 VITALS — BP 136/82 | HR 111 | Ht 64.0 in | Wt 145.5 lb

## 2016-09-13 DIAGNOSIS — R002 Palpitations: Secondary | ICD-10-CM

## 2016-09-13 NOTE — Progress Notes (Signed)
Cardiology Office Note   Date:  09/13/2016   ID:  Sherry Park, DOB [birth year], MRN [id number]  PCP:  Sherry Haddock, NP  Cardiologist:   Sherry [place], MD   Chief Complaint  Patient presents with  . OTHER    F/u holter no complaints today. Meds reviewed verbally with pt.      History of Present Illness: Sherry Park is a 30 y.o. female who Is here today for a follow-up visit regarding palpitations.  Echocardiogram was normal. Holter monitor showed normal sinus rhythm with occasional PACs with one episode of narrow complex tachycardia at a heart rate of 163 bpm which was likely sinus tachycardia. The patient's symptoms were felt to be due to anxiety and possible panic attacks. There also might be a component of inappropriate sinus tachycardia. Her heart rate tends to be on the high side very frequently.   Past Medical History:  Diagnosis Date  . Headache    a. felt to be 2/2 Dostinex  . Hemorrhoids   . Microprolactinoma ([place])    a. on Dostinex; b. followed by Dr. Gabriel Carina, MD  . Ovarian cyst     History reviewed. No pertinent surgical history.   Current Outpatient Prescriptions  Medication Sig Dispense Refill  . cabergoline (DOSTINEX) 0.5 MG tablet Take 0.25 mg by mouth 2 (two) times a week.    . Multiple Vitamin (MULTIVITAMIN) tablet Take 1 tablet by mouth daily.     No current facility-administered medications for this visit.     Allergies:   Patient has no active allergies.    Social History:  The patient  reports that she has never smoked. She has never used smokeless tobacco. She reports that she does not drink alcohol or use drugs.   Family History:  The patient's family history includes Diabetes Mellitus II in her mother; Hypertension in her mother; Kidney disease in her father; Stroke in her mother.    ROS:  Please see the history of present illness.   Otherwise, review of systems are positive for none.   All other systems are reviewed and negative.      PHYSICAL EXAM: VS:  BP 136/82 (BP Location: Left Arm, Patient Position: Sitting, Cuff Size: Normal)   Pulse (!) 111   Ht 5\' 4"  (1.626 m)   Wt 145 lb 8 oz (66 kg)   BMI 24.98 kg/m  , BMI Body mass index is 24.98 kg/m. GEN: Well nourished, well developed, in no acute distress  HEENT: normal  Neck: no JVD, carotid bruits, or masses Cardiac: RRR; no murmurs, rubs, or gallops,no edema  Respiratory:  clear to auscultation bilaterally, normal work of breathing GI: soft, nontender, nondistended, + BS MS: no deformity or atrophy  Skin: warm and dry, no rash Neuro:  Strength and sensation are intact Psych: euthymic mood, full affect   EKG:  EKG is ordered today. The ekg ordered today demonstrates normal sinus rhythm with no significant ST or T wave changes. Normal PR and QT intervals.   Recent Labs: 04/22/2016: Hemoglobin 14.0; TSH 1.475 04/24/2016: Magnesium 2.1 06/06/2016: ALT 8; BUN 9; Creatinine, Ser 0.68; Platelets 172; Potassium 3.9; Sodium 141    Lipid Panel No results found for: CHOL, TRIG, HDL, CHOLHDL, VLDL, LDLCALC, LDLDIRECT    Wt Readings from Last 3 Encounters:  09/13/16 145 lb 8 oz (66 kg)  07/10/16 141 lb (64 kg)  07/03/16 141 lb (64 kg)      ASSESSMENT AND PLAN:  1.  Palpitations:  Possibly due to inappropriate sinus tachycardia worsened by underlying stress and anxiety. Cardiac workup so far has been reassuring. She reports some improvement in her symptoms and I explained to her that treatment with a beta blocker can be considered for symptom relief. For now she seems to be able to control her symptoms without the need for intervention.  Disposition:   FU with me in 1 year.   Signed,  Sherry [place], MD  09/13/2016 11:41 AM    [place]

## 2016-09-13 NOTE — Patient Instructions (Signed)
Medication Instructions: No change    Labwork: None.   Procedures/Testing: None.   Follow-Up: 1 year with Dr. Arida.   Any Additional Special Instructions Will Be Listed Below (If Applicable).     If you need a refill on your cardiac medications before your next appointment, please call your pharmacy.   

## 2016-09-19 ENCOUNTER — Ambulatory Visit: Payer: PRIVATE HEALTH INSURANCE | Admitting: General Surgery

## 2016-10-08 ENCOUNTER — Ambulatory Visit (INDEPENDENT_AMBULATORY_CARE_PROVIDER_SITE_OTHER): Payer: PRIVATE HEALTH INSURANCE | Admitting: General Surgery

## 2016-10-08 ENCOUNTER — Encounter: Payer: Self-pay | Admitting: General Surgery

## 2016-10-08 VITALS — BP 130/70 | HR 86 | Resp 12 | Ht 64.0 in | Wt 147.0 lb

## 2016-10-08 DIAGNOSIS — N644 Mastodynia: Secondary | ICD-10-CM | POA: Diagnosis not present

## 2016-10-08 NOTE — Progress Notes (Signed)
Patient ID: [name], female   DOB: 11-01-1985, 31 y.o.   MRN: [id number]  Chief Complaint  Patient presents with  . Follow-up    left breast pain    HPI Sherry Park is a 31 y.o. female.  Here today for follow up left breast pain. She states she only has some pain occasionally. Patient was seen in October 2017 for left breast cyst. The patient reports since discontinuing her oral contraceptives her menses and become irregular once again.    HPI  Past Medical History:  Diagnosis Date  . Headache    a. felt to be 2/2 Dostinex  . Hemorrhoids   . Microprolactinoma ([place])    a. on Dostinex; b. followed by Dr. Gabriel Carina, MD  . Ovarian cyst     No past surgical history on file.  Family History  Problem Relation Age of Onset  . Diabetes Mellitus II Mother   . Hypertension Mother   . Stroke Mother     disabling stroke from oral contraceptive pill use at 31 years of age, no family hx of CVA  . Kidney disease Father     Social History Social History  Substance Use Topics  . Smoking status: Never Smoker  . Smokeless tobacco: Never Used  . Alcohol use No    No Active Allergies  Current Outpatient Prescriptions  Medication Sig Dispense Refill  . cabergoline (DOSTINEX) 0.5 MG tablet Take 0.25 mg by mouth 2 (two) times a week.    . Multiple Vitamin (MULTIVITAMIN) tablet Take 1 tablet by mouth daily.     No current facility-administered medications for this visit.     Review of Systems Review of Systems  Constitutional: Negative.   Respiratory: Negative.   Cardiovascular: Negative.     Blood pressure 130/70, pulse 86, resp. rate 12, height 5\' 4"  (1.626 m), weight 147 lb (66.7 kg), last menstrual period 09/27/2016.  Physical Exam Physical Exam  Constitutional: She is oriented to person, place, and time. She appears well-developed and well-nourished.  Cardiovascular: Normal rate, regular rhythm and normal heart sounds.   Pulmonary/Chest: Right breast exhibits no  inverted nipple, no mass, no nipple discharge, no skin change and no tenderness. Left breast exhibits no inverted nipple, no mass, no nipple discharge, no skin change and no tenderness.    Neurological: She is alert and oriented to person, place, and time.  Skin: Skin is warm and dry.       Assessment    Benign breast exam.    Plan          Patient to return as needed. The patient is aware to call back for any questions or concerns.   Robert Bellow 10/09/2016, 2:33 PM

## 2016-10-08 NOTE — Patient Instructions (Addendum)
The patient is aware to call back for any questions or concerns. Return as needed.  

## 2016-12-02 ENCOUNTER — Encounter: Payer: Self-pay | Admitting: Family Medicine

## 2016-12-02 ENCOUNTER — Ambulatory Visit (INDEPENDENT_AMBULATORY_CARE_PROVIDER_SITE_OTHER): Payer: BLUE CROSS/BLUE SHIELD | Admitting: Family Medicine

## 2016-12-02 VITALS — BP 131/82 | HR 81 | Temp 99.3°F | Wt 150.7 lb

## 2016-12-02 DIAGNOSIS — K625 Hemorrhage of anus and rectum: Secondary | ICD-10-CM | POA: Diagnosis not present

## 2016-12-02 LAB — CBC WITH DIFFERENTIAL/PLATELET
Hematocrit: 41.2 % (ref 34.0–46.6)
Hemoglobin: 14.4 g/dL (ref 11.1–15.9)
LYMPHS: 17 %
Lymphocytes Absolute: 0.9 10*3/uL (ref 0.7–3.1)
MCH: 31.7 pg (ref 26.6–33.0)
MCHC: 35 g/dL (ref 31.5–35.7)
MCV: 91 fL (ref 79–97)
MID (Absolute): 0.4 10*3/uL (ref 0.1–1.6)
MID: 7 %
NEUTROS PCT: 77 %
Neutrophils Absolute: 4.1 10*3/uL (ref 1.4–7.0)
PLATELETS: 161 10*3/uL (ref 150–379)
RBC: 4.54 x10E6/uL (ref 3.77–5.28)
RDW: 12.6 % (ref 12.3–15.4)
WBC: 5.4 10*3/uL (ref 3.4–10.8)

## 2016-12-02 MED ORDER — HYDROCORTISONE ACETATE 25 MG RE SUPP: 25.0000 mg | Freq: Two times a day (BID) | RECTAL | 6 refills | Status: DC

## 2016-12-02 NOTE — Patient Instructions (Addendum)
Probiotic, miralax, fiber supplement, LOTS OF WATER

## 2016-12-02 NOTE — Progress Notes (Signed)
   BP 131/82 (BP Location: Left Arm, Patient Position: Sitting, Cuff Size: Normal)   Pulse 81   Temp 99.3 F (37.4 C)   Wt 150 lb 11.2 oz (68.4 kg)   SpO2 100%   BMI 25.87 kg/m    Subjective:    Patient ID: Sherry Park, female    DOB: 04/15/1986, 31 y.o.   MRN: [id number]  HPI: Sherry Park is a 31 y.o. female  Chief Complaint  Patient presents with  . Blood In Stools    Bright Red  . Rectal Pain  . Constipation   Seeing bright red blood in the toilet bowl and on the toilet paper. Has been having issues off and on since the summer, but things have gotten worse the past 3 days. Now happening every time she has a BM. Bleeding stops after BM is passed. Is having sharp pain during BMs - about 8/10. Pain resolves once BM is passed. Has been trying Prep H which helps some. Has been constipated lately and does have a hx of hemorrhoids.   Relevant past medical, surgical, family and social history reviewed and updated as indicated. Interim medical history since our last visit reviewed. Allergies and medications reviewed and updated.  Review of Systems  Constitutional: Negative.   HENT: Negative.   Respiratory: Negative.   Cardiovascular: Negative.   Gastrointestinal: Positive for blood in stool and rectal pain.  Genitourinary: Negative.   Musculoskeletal: Negative.   Neurological: Negative.   Psychiatric/Behavioral: Negative.     Per HPI unless specifically indicated above     Objective:    BP 131/82 (BP Location: Left Arm, Patient Position: Sitting, Cuff Size: Normal)   Pulse 81   Temp 99.3 F (37.4 C)   Wt 150 lb 11.2 oz (68.4 kg)   SpO2 100%   BMI 25.87 kg/m   Wt Readings from Last 3 Encounters:  12/02/16 150 lb 11.2 oz (68.4 kg)  10/08/16 147 lb (66.7 kg)  09/13/16 145 lb 8 oz (66 kg)    Physical Exam  Constitutional: She is oriented to person, place, and time. She appears well-developed and well-nourished. No distress.  HENT:  Head: Atraumatic.  Eyes:  Conjunctivae are normal. Pupils are equal, round, and reactive to light.  Neck: Normal range of motion. Neck supple.  Cardiovascular: Normal rate and normal heart sounds.   Pulmonary/Chest: Effort normal and breath sounds normal. No respiratory distress.  Abdominal: Soft. Bowel sounds are normal. She exhibits no distension. There is no tenderness.  Genitourinary: Rectal exam shows internal hemorrhoid, tenderness and guaiac positive stool (minimally positive).  Musculoskeletal: Normal range of motion.  Neurological: She is alert and oriented to person, place, and time.  Skin: Skin is warm and dry.  Psychiatric: She has a normal mood and affect. Her behavior is normal.  Nursing note and vitals reviewed.     Assessment & Plan:   Problem List Items Addressed This Visit    None    Visit Diagnoses    Rectal bleeding    -  Primary   CBC WNL, internal hemorrhoids present on palpation. Cannot rule out fissure. Discussed suppositories and sitz baths prn, miralax and fiber supplements daily.   Relevant Orders   CBC With Differential/Platelet    Discussed importance of keeping stools soft and regular and avoiding straining. Increase water intake. Return precautions given.    Follow up plan: Return for physical exam.

## 2016-12-03 ENCOUNTER — Encounter: Payer: Self-pay | Admitting: Family Medicine

## 2016-12-03 NOTE — Telephone Encounter (Signed)
Please advise 

## 2017-01-20 ENCOUNTER — Other Ambulatory Visit: Payer: Self-pay | Admitting: Obstetrics and Gynecology

## 2017-01-20 DIAGNOSIS — R102 Pelvic and perineal pain: Secondary | ICD-10-CM

## 2017-01-28 ENCOUNTER — Ambulatory Visit: Admission: RE | Admit: 2017-01-28 | Payer: BLUE CROSS/BLUE SHIELD | Source: Ambulatory Visit

## 2017-02-06 ENCOUNTER — Ambulatory Visit: Payer: BLUE CROSS/BLUE SHIELD

## 2017-02-06 ENCOUNTER — Ambulatory Visit
Admission: RE | Admit: 2017-02-06 | Discharge: 2017-02-06 | Disposition: A | Discharge status: Home / Self Care | Payer: BLUE CROSS/BLUE SHIELD | Source: Ambulatory Visit | Attending: Obstetrics and Gynecology | Admitting: Obstetrics and Gynecology

## 2017-02-06 DIAGNOSIS — R102 Pelvic and perineal pain: Secondary | ICD-10-CM | POA: Diagnosis not present

## 2017-02-06 MED ORDER — IOPAMIDOL (ISOVUE-300) INJECTION 61%
100.0000 mL | Freq: Once | INTRAVENOUS | Status: AC | PRN
  Administered 2017-02-06: 100 mL via INTRAVENOUS

## 2017-03-07 ENCOUNTER — Telehealth: Payer: Self-pay | Admitting: Unknown Physician Specialty

## 2017-03-07 NOTE — Telephone Encounter (Signed)
Yes, patient would need to be seen. Thank you.

## 2017-03-07 NOTE — Telephone Encounter (Signed)
Patient has scratchy throat since Tuesday and pain in her right ear. Informed patient all appointments for this morning were booked  But that we can schedule her for this afternoon. Patient did not want to come in this afternoon. Informed patient to go to Urgent Care.

## 2017-03-11 LAB — HM PAP SMEAR

## 2017-03-25 ENCOUNTER — Encounter: Payer: Self-pay | Admitting: Unknown Physician Specialty

## 2017-03-25 ENCOUNTER — Ambulatory Visit (INDEPENDENT_AMBULATORY_CARE_PROVIDER_SITE_OTHER): Payer: BLUE CROSS/BLUE SHIELD | Admitting: Unknown Physician Specialty

## 2017-03-25 VITALS — BP 138/82 | HR 119 | Temp 98.8°F | Wt 149.4 lb

## 2017-03-25 DIAGNOSIS — B370 Candidal stomatitis: Secondary | ICD-10-CM | POA: Diagnosis not present

## 2017-03-25 MED ORDER — FLUCONAZOLE 150 MG PO TABS: 150.0000 mg | ORAL_TABLET | Freq: Once | ORAL | 0 refills | Status: AC

## 2017-03-25 MED ORDER — NYSTATIN 100000 UNIT/ML MT SUSP: 5.0000 mL | Freq: Four times a day (QID) | OROMUCOSAL | 0 refills | Status: DC

## 2017-03-25 NOTE — Progress Notes (Signed)
BP 138/82   Pulse (!) 119   Temp 98.8 F (37.1 C)   Wt 149 lb 6.4 oz (67.8 kg)   LMP 03/15/2017 (Approximate)   SpO2 99%   BMI 25.64 kg/m    Subjective:    Patient ID: Sherry Park, female    DOB: 03-Dec-1985, 31 y.o.   MRN: [id number]  HPI: Sherry Park is a 31 y.o. female  Chief Complaint  Patient presents with  . Thrush    pt states she has noticed white stuff in her mouth since taking doxycycline, states she finsihed the doxy on Friday and started noticing the white stuff a couple of days before finsihing the doxy   Doxycycline for 10 days through [facility].  Noted white film on tongue.  No irritation or pain.  Just noted the coating which she can't brush off  Relevant past medical, surgical, family and social history reviewed and updated as indicated. Interim medical history since our last visit reviewed. Allergies and medications reviewed and updated.  Review of Systems  Per HPI unless specifically indicated above     Objective:    BP 138/82   Pulse (!) 119   Temp 98.8 F (37.1 C)   Wt 149 lb 6.4 oz (67.8 kg)   LMP 03/15/2017 (Approximate)   SpO2 99%   BMI 25.64 kg/m   Wt Readings from Last 3 Encounters:  03/25/17 149 lb 6.4 oz (67.8 kg)  12/02/16 150 lb 11.2 oz (68.4 kg)  10/08/16 147 lb (66.7 kg)    Physical Exam  Constitutional: She is oriented to person, place, and time. She appears well-developed and well-nourished. No distress.  HENT:  Head: Normocephalic and atraumatic.  Noted white coating on tongue  Eyes: Conjunctivae and lids are normal. Right eye exhibits no discharge. Left eye exhibits no discharge. No scleral icterus.  Neck: Normal range of motion. Neck supple. No JVD present. Carotid bruit is not present.  Cardiovascular: Normal rate, regular rhythm and normal heart sounds.   Pulmonary/Chest: Effort normal and breath sounds normal.  Abdominal: Normal appearance. There is no splenomegaly or hepatomegaly.  Musculoskeletal: Normal range of  motion.  Neurological: She is alert and oriented to person, place, and time.  Skin: Skin is warm, dry and intact. No rash noted. No pallor.  Psychiatric: She has a normal mood and affect. Her behavior is normal. Judgment and thought content normal.    Results for orders placed or performed in visit on 12/02/16  CBC With Differential/Platelet  Result Value Ref Range   WBC 5.4 3.4 - 10.8 x10E3/uL   RBC 4.54 3.77 - 5.28 x10E6/uL   Hemoglobin 14.4 11.1 - 15.9 g/dL   Hematocrit 41.2 34.0 - 46.6 %   MCV 91 79 - 97 fL   MCH 31.7 26.6 - 33.0 pg   MCHC 35.0 31.5 - 35.7 g/dL   RDW 12.6 12.3 - 15.4 %   Platelets 161 150 - 379 x10E3/uL   Neutrophils 77 Not Estab. %   Lymphs 17 Not Estab. %   MID 7 Not Estab. %   Neutrophils Absolute 4.1 1.4 - 7.0 x10E3/uL   Lymphocytes Absolute 0.9 0.7 - 3.1 x10E3/uL   MID (Absolute) 0.4 0.1 - 1.6 X10E3/uL      Assessment & Plan:   Problem List Items Addressed This Visit    None    Visit Diagnoses    Oral thrush    -  Primary   Rx for Diflucan as also having vaginal irritation.  Seeing gyn for this. Additional Nystatin given.     Relevant Medications   fluconazole (DIFLUCAN) 150 MG tablet   nystatin (MYCOSTATIN) 100000 UNIT/ML suspension       Follow up plan: Return if symptoms worsen or fail to improve.

## 2017-05-19 ENCOUNTER — Encounter: Payer: Self-pay | Admitting: Unknown Physician Specialty

## 2017-05-19 ENCOUNTER — Ambulatory Visit (INDEPENDENT_AMBULATORY_CARE_PROVIDER_SITE_OTHER): Payer: BLUE CROSS/BLUE SHIELD | Admitting: Unknown Physician Specialty

## 2017-05-19 VITALS — BP 134/85 | HR 121 | Temp 98.4°F | Ht 62.8 in | Wt 149.3 lb

## 2017-05-19 DIAGNOSIS — I10 Essential (primary) hypertension: Secondary | ICD-10-CM

## 2017-05-19 DIAGNOSIS — Z0000 Encounter for general adult medical examination without abnormal findings: Secondary | ICD-10-CM

## 2017-05-19 DIAGNOSIS — M25562 Pain in left knee: Secondary | ICD-10-CM

## 2017-05-19 DIAGNOSIS — F411 Generalized anxiety disorder: Secondary | ICD-10-CM

## 2017-05-19 NOTE — Patient Instructions (Addendum)
DASH Eating Plan DASH stands for "Dietary Approaches to Stop Hypertension." The DASH eating plan is a healthy eating plan that has been shown to reduce high blood pressure (hypertension). It may also reduce your risk for type 2 diabetes, heart disease, and stroke. The DASH eating plan may also help with weight loss. What are tips for following this plan? General guidelines  Avoid eating more than 2,300 mg (milligrams) of salt (sodium) a day. If you have hypertension, you may need to reduce your sodium intake to 1,500 mg a day.  Limit alcohol intake to no more than 1 drink a day for nonpregnant women and 2 drinks a day for men. One drink equals 12 oz of beer, 5 oz of wine, or 1 oz of hard liquor.  Work with your health care provider to maintain a healthy body weight or to lose weight. Ask what an ideal weight is for you.  Get at least 30 minutes of exercise that causes your heart to beat faster (aerobic exercise) most days of the week. Activities may include walking, swimming, or biking.  Work with your health care provider or diet and nutrition specialist (dietitian) to adjust your eating plan to your individual calorie needs. Reading food labels  Check food labels for the amount of sodium per serving. Choose foods with less than 5 percent of the Daily Value of sodium. Generally, foods with less than 300 mg of sodium per serving fit into this eating plan.  To find whole grains, look for the word "whole" as the first word in the ingredient list. Shopping  Buy products labeled as "low-sodium" or "no salt added."  Buy fresh foods. Avoid canned foods and premade or frozen meals. Cooking  Avoid adding salt when cooking. Use salt-free seasonings or herbs instead of table salt or sea salt. Check with your health care provider or pharmacist before using salt substitutes.  Do not fry foods. Cook foods using healthy methods such as baking, boiling, grilling, and broiling instead.  Cook with  heart-healthy oils, such as olive, canola, soybean, or sunflower oil. Meal planning   Eat a balanced diet that includes: ? 5 or more servings of fruits and vegetables each day. At each meal, try to fill half of your plate with fruits and vegetables. ? Up to 6-8 servings of whole grains each day. ? Less than 6 oz of lean meat, poultry, or fish each day. A 3-oz serving of meat is about the same size as a deck of cards. One egg equals 1 oz. ? 2 servings of low-fat dairy each day. ? A serving of nuts, seeds, or beans 5 times each week. ? Heart-healthy fats. Healthy fats called Omega-3 fatty acids are found in foods such as flaxseeds and coldwater fish, like sardines, salmon, and mackerel.  Limit how much you eat of the following: ? Canned or prepackaged foods. ? Food that is high in trans fat, such as fried foods. ? Food that is high in saturated fat, such as fatty meat. ? Sweets, desserts, sugary drinks, and other foods with added sugar. ? Full-fat dairy products.  Do not salt foods before eating.  Try to eat at least 2 vegetarian meals each week.  Eat more home-cooked food and less restaurant, buffet, and fast food.  When eating at a restaurant, ask that your food be prepared with less salt or no salt, if possible. What foods are recommended? The items listed may not be a complete list. Talk with your dietitian about what   dietary choices are best for you. Grains Whole-grain or whole-wheat bread. Whole-grain or whole-wheat pasta. Brown rice. [place]. Bulgur. Whole-grain and low-sodium cereals. Pita bread. Low-fat, low-sodium crackers. Whole-wheat flour tortillas. Vegetables Fresh or frozen vegetables (raw, steamed, roasted, or grilled). Low-sodium or reduced-sodium tomato and vegetable juice. Low-sodium or reduced-sodium tomato sauce and tomato paste. Low-sodium or reduced-sodium canned vegetables. Fruits All fresh, dried, or frozen fruit. Canned fruit in natural juice (without  added sugar). Meat and other protein foods Skinless chicken or [place]. Ground chicken or [place]. Pork with fat trimmed off. Fish and seafood. Egg whites. Dried beans, peas, or lentils. Unsalted nuts, nut butters, and seeds. Unsalted canned beans. Lean cuts of beef with fat trimmed off. Low-sodium, lean deli meat. Dairy Low-fat (1%) or fat-free (skim) milk. Fat-free, low-fat, or reduced-fat cheeses. Nonfat, low-sodium ricotta or cottage cheese. Low-fat or nonfat yogurt. Low-fat, low-sodium cheese. Fats and oils Soft margarine without trans fats. Vegetable oil. Low-fat, reduced-fat, or light mayonnaise and salad dressings (reduced-sodium). Canola, safflower, olive, soybean, and sunflower oils. Avocado. Seasoning and other foods Herbs. Spices. Seasoning mixes without salt. Unsalted popcorn and pretzels. Fat-free sweets. What foods are not recommended? The items listed may not be a complete list. Talk with your dietitian about what dietary choices are best for you. Grains Baked goods made with fat, such as croissants, muffins, or some breads. Dry pasta or rice meal packs. Vegetables Creamed or fried vegetables. Vegetables in a cheese sauce. Regular canned vegetables (not low-sodium or reduced-sodium). Regular canned tomato sauce and paste (not low-sodium or reduced-sodium). Regular tomato and vegetable juice (not low-sodium or reduced-sodium). Angie Fava. Olives. Fruits Canned fruit in a light or heavy syrup. Fried fruit. Fruit in cream or butter sauce. Meat and other protein foods Fatty cuts of meat. Ribs. Fried meat. Berniece Salines. Sausage. Bologna and other processed lunch meats. Salami. Fatback. Hotdogs. Bratwurst. Salted nuts and seeds. Canned beans with added salt. Canned or smoked fish. Whole eggs or egg yolks. Chicken or [place] with skin. Dairy Whole or 2% milk, cream, and half-and-half. Whole or full-fat cream cheese. Whole-fat or sweetened yogurt. Full-fat cheese. Nondairy creamers. Whipped toppings.  Processed cheese and cheese spreads. Fats and oils Butter. Stick margarine. Lard. Shortening. Ghee. Bacon fat. Tropical oils, such as coconut, palm kernel, or palm oil. Seasoning and other foods Salted popcorn and pretzels. Onion salt, garlic salt, seasoned salt, table salt, and sea salt. Worcestershire sauce. Tartar sauce. Barbecue sauce. Teriyaki sauce. Soy sauce, including reduced-sodium. Steak sauce. Canned and packaged gravies. Fish sauce. Oyster sauce. Cocktail sauce. Horseradish that you find on the shelf. Ketchup. Mustard. Meat flavorings and tenderizers. Bouillon cubes. Hot sauce and Tabasco sauce. Premade or packaged marinades. Premade or packaged taco seasonings. Relishes. Regular salad dressings. Where to find more information:  National Heart, Lung, and [place]: https://wilson-eaton.com/  American Heart Association: www.heart.org Summary  The DASH eating plan is a healthy eating plan that has been shown to reduce high blood pressure (hypertension). It may also reduce your risk for type 2 diabetes, heart disease, and stroke.  With the DASH eating plan, you should limit salt (sodium) intake to 2,300 mg a day. If you have hypertension, you may need to reduce your sodium intake to 1,500 mg a day.  When on the DASH eating plan, aim to eat more fresh fruits and vegetables, whole grains, lean proteins, low-fat dairy, and heart-healthy fats.  Work with your health care provider or diet and nutrition specialist (dietitian) to adjust your eating plan to your individual  calorie needs. This information is not intended to replace advice given to you by your health care provider. Make sure you discuss any questions you have with your health care provider. Document Released: 09/12/2011 Document Revised: 09/16/2016 Document Reviewed: 09/16/2016 Elsevier Interactive Patient Education  2017 Elsevier Inc.  Panic Attacks Panic attacks are sudden, short feelings of great fear or discomfort. You may  have them for no reason when you are relaxed, when you are uneasy (anxious), or when you are sleeping. Follow these instructions at home:  Take all your medicines as told.  Check with your doctor before starting new medicines.  Keep all doctor visits. Contact a doctor if:  You are not able to take your medicines as told.  Your symptoms do not get better.  Your symptoms get worse. Get help right away if:  Your attacks seem different than your normal attacks.  You have thoughts about hurting yourself or others.  You take panic attack medicine and you have a side effect. This information is not intended to replace advice given to you by your health care provider. Make sure you discuss any questions you have with your health care provider. Document Released: 10/26/2010 Document Revised: 02/29/2016 Document Reviewed: 05/07/2013 Elsevier Interactive Patient Education  2017 Elsevier Inc.   Preventing Hypertension Hypertension, commonly called high blood pressure, is when the force of blood pumping through the arteries is too strong. Arteries are blood vessels that carry blood from the heart throughout the body. Over time, hypertension can damage the arteries and decrease blood flow to important parts of the body, including the brain, heart, and kidneys. Often, hypertension does not cause symptoms until blood pressure is very high. For this reason, it is important to have your blood pressure checked on a regular basis. Hypertension can often be prevented with diet and lifestyle changes. If you already have hypertension, you can control it with diet and lifestyle changes, as well as medicine. What nutrition changes can be made? Maintain a healthy diet. This includes:  Eating less salt (sodium). Ask your health care provider how much sodium is safe for you to have. The general recommendation is to consume less than 1 tsp (2,300 mg) of sodium a day. ? Do not add salt to your food. ? Choose  low-sodium options when grocery shopping and eating out.  Limiting fats in your diet. You can do this by eating low-fat or fat-free dairy products and by eating less red meat.  Eating more fruits, vegetables, and whole grains. Make a goal to eat: ? 1-2 cups of fresh fruits and vegetables each day. ? 3-4 servings of whole grains each day.  Avoiding foods and beverages that have added sugars.  Eating fish that contain healthy fats (omega-3 fatty acids), such as mackerel or salmon.  If you need help putting together a healthy eating plan, try the DASH diet. This diet is high in fruits, vegetables, and whole grains. It is low in sodium, red meat, and added sugars. DASH stands for Dietary Approaches to Stop Hypertension. What lifestyle changes can be made?  Lose weight if you are overweight. Losing just 3?5% of your body weight can help prevent or control hypertension. ? For example, if your present weight is 200 lb (91 kg), a loss of 3-5% of your weight means losing 6-10 lb (2.7-4.5 kg). ? Ask your health care provider to help you with a diet and exercise plan to safely lose weight.  Get enough exercise. Do at least 150 minutes of  moderate-intensity exercise each week. ? You could do this in short exercise sessions several times a day, or you could do longer exercise sessions a few times a week. For example, you could take a brisk 10-minute walk or bike ride, 3 times a day, for 5 days a week.  Find ways to reduce stress, such as exercising, meditating, listening to music, or taking a yoga class. If you need help reducing stress, ask your health care provider.  Do not smoke. This includes e-cigarettes. Chemicals in tobacco and nicotine products raise your blood pressure each time you smoke. If you need help quitting, ask your health care provider.  Avoid alcohol. If you drink alcohol, limit alcohol intake to no more than 1 drink a day for nonpregnant women and 2 drinks a day for men. One drink  equals 12 oz of beer, 5 oz of wine, or 1 oz of hard liquor. Why are these changes important? Diet and lifestyle changes can help you prevent hypertension, and they may make you feel better overall and improve your quality of life. If you have hypertension, making these changes will help you control it and help prevent major complications, such as:  Hardening and narrowing of arteries that supply blood to: ? Your heart. This can cause a heart attack. ? Your brain. This can cause a stroke. ? Your kidneys. This can cause kidney failure.  Stress on your heart muscle, which can cause heart failure.  What can I do to lower my risk?  Work with your health care provider to make a hypertension prevention plan that works for you. Follow your plan and keep all follow-up visits as told by your health care provider.  Learn how to check your blood pressure at home. Make sure that you know your personal target blood pressure, as told by your health care provider. How is this treated? In addition to diet and lifestyle changes, your health care provider may recommend medicines to help lower your blood pressure. You may need to try a few different medicines to find what works best for you. You also may need to take more than one medicine. Take over-the-counter and prescription medicines only as told by your health care provider. Where to find support: Your health care provider can help you prevent hypertension and help you keep your blood pressure at a healthy level. Your local hospital or your community may also provide support services and prevention programs. The American Heart Association offers an online support network at: CheapBootlegs.com.cy Where to find more information: Learn more about hypertension from:  National Heart, Lung, and Blood Institute: ElectronicHangman.is  Centers for Disease Control and Prevention:  https://ingram.com/  American Academy of Family Physicians: http://familydoctor.org/familydoctor/en/diseases-conditions/high-blood-pressure.printerview.all.html  Learn more about the DASH diet from:  [place], Lung, and [place]: https://www.reyes.com/  Contact a health care provider if:  You think you are having a reaction to medicines you have taken.  You have recurrent headaches or feel dizzy.  You have swelling in your ankles.  You have trouble with your vision. Summary  Hypertension often does not cause any symptoms until blood pressure is very high. It is important to get your blood pressure checked regularly.  Diet and lifestyle changes are the most important steps in preventing hypertension.  By keeping your blood pressure in a healthy range, you can prevent complications like heart attack, heart failure, stroke, and kidney failure.  Work with your health care provider to make a hypertension prevention plan that works for you. This  information is not intended to replace advice given to you by your health care provider. Make sure you discuss any questions you have with your health care provider. Document Released: 10/08/2015 Document Revised: 06/03/2016 Document Reviewed: 06/03/2016 Elsevier Interactive Patient Education  2017 [place] 18-39 Years, Female Preventive care refers to lifestyle choices and visits with your health care provider that can promote health and wellness. What does preventive care include?  A yearly physical exam. This is also called an annual well check.  Dental exams once or twice a year.  Routine eye exams. Ask your health care provider how often you should have your eyes checked.  Personal lifestyle choices, including: ? Daily care of your teeth and gums. ? Regular physical activity. ? Eating a healthy diet. ? Avoiding tobacco and drug use. ? Limiting alcohol  use. ? Practicing safe sex. ? Taking vitamin and mineral supplements as recommended by your health care provider. What happens during an annual well check? The services and screenings done by your health care provider during your annual well check will depend on your age, overall health, lifestyle risk factors, and family history of disease. Counseling Your health care provider may ask you questions about your:  Alcohol use.  Tobacco use.  Drug use.  Emotional well-being.  Home and relationship well-being.  Sexual activity.  Eating habits.  Work and work Statistician.  Method of birth control.  Menstrual cycle.  Pregnancy history.  Screening You may have the following tests or measurements:  Height, weight, and BMI.  Diabetes screening. This is done by checking your blood sugar (glucose) after you have not eaten for a while (fasting).  Blood pressure.  Lipid and cholesterol levels. These may be checked every 5 years starting at age 36.  Skin check.  Hepatitis C blood test.  Hepatitis B blood test.  Sexually transmitted disease (STD) testing.  BRCA-related cancer screening. This may be done if you have a family history of breast, ovarian, tubal, or peritoneal cancers.  Pelvic exam and Pap test. This may be done every 3 years starting at age 20. Starting at age 81, this may be done every 5 years if you have a Pap test in combination with an HPV test.  Discuss your test results, treatment options, and if necessary, the need for more tests with your health care provider. Vaccines Your health care provider may recommend certain vaccines, such as:  Influenza vaccine. This is recommended every year.  Tetanus, diphtheria, and acellular pertussis (Tdap, Td) vaccine. You may need a Td booster every 10 years.  Varicella vaccine. You may need this if you have not been vaccinated.  HPV vaccine. If you are 74 or younger, you may need three doses over 6  months.  Measles, mumps, and rubella (MMR) vaccine. You may need at least one dose of MMR. You may also need a second dose.  Pneumococcal 13-valent conjugate (PCV13) vaccine. You may need this if you have certain conditions and were not previously vaccinated.  Pneumococcal polysaccharide (PPSV23) vaccine. You may need one or two doses if you smoke cigarettes or if you have certain conditions.  Meningococcal vaccine. One dose is recommended if you are age 47-21 years and a first-year college student living in a residence hall, or if you have one of several medical conditions. You may also need additional booster doses.  Hepatitis A vaccine. You may need this if you have certain conditions or if you travel or work in places where you  may be exposed to hepatitis A.  Hepatitis B vaccine. You may need this if you have certain conditions or if you travel or work in places where you may be exposed to hepatitis B.  Haemophilus influenzae type b (Hib) vaccine. You may need this if you have certain risk factors.  Talk to your health care provider about which screenings and vaccines you need and how often you need them. This information is not intended to replace advice given to you by your health care provider. Make sure you discuss any questions you have with your health care provider.  -------------------------------------------------------------- Psychology today>Find a counselor

## 2017-05-19 NOTE — Progress Notes (Signed)
BP 134/85 (BP Location: Left Arm, Cuff Size: Normal)   Pulse (!) 121   Temp 98.4 F (36.9 C)   Ht 5' 2.8" (1.595 m)   Wt 149 lb 4.8 oz (67.7 kg)   LMP  (LMP Unknown)   SpO2 100%   BMI 26.62 kg/m    Subjective:    Patient ID: Sherry Park, female    DOB: 1986-07-05, 31 y.o.   MRN: [id number]  HPI: Sherry Park is a 31 y.o. female  Chief Complaint  Patient presents with  . Annual Exam    pt states she had PAP in June at [facility]   Left knee pain States she has left lower leg pain, starts from the knee down.  Been going on for about 1 year off and on.  Sitting and standing on it a long time makes it worse.  Once it starts hurting, nothing gives it relief.   Anxiety Has generalized anxiety and sometimes feels panicky.  This is long-standing Depression screen PHQ 2/9 05/19/2017  Decreased Interest 0  Down, Depressed, Hopeless 0  PHQ - 2 Score 0  Altered sleeping 1  Tired, decreased energy 1  Change in appetite 1  Feeling bad or failure about yourself  0  Trouble concentrating 0  Moving slowly or fidgety/restless 0  Suicidal thoughts 0  PHQ-9 Score 3      Social History   Social History  . Marital status: Single    Spouse name: N/A  . Number of children: N/A  . Years of education: N/A   Occupational History  . Not on file.   Social History Main Topics  . Smoking status: Never Smoker  . Smokeless tobacco: Never Used  . Alcohol use No  . Drug use: No  . Sexual activity: Yes   Other Topics Concern  . Not on file   Social History Narrative  . No narrative on file   Family History  Problem Relation Age of Onset  . Diabetes Mellitus II Mother   . Hypertension Mother   . Stroke Mother        disabling stroke from oral contraceptive pill use at 30 years of age, no family hx of CVA  . Kidney disease Father   . Hypertension Sister   . Congestive Heart Failure Maternal Grandmother   . Kidney disease Maternal Grandmother   . Cancer Paternal Grandfather         skin   Past Medical History:  Diagnosis Date  . Headache    a. felt to be 2/2 Dostinex  . Hemorrhoids   . Microprolactinoma ([place])    a. on Dostinex; b. followed by Dr. Gabriel Carina, MD  . Ovarian cyst    History reviewed. No pertinent surgical history.  Relevant past medical, surgical, family and social history reviewed and updated as indicated. Interim medical history since our last visit reviewed. Allergies and medications reviewed and updated.  Review of Systems  Constitutional: Negative.   HENT: Negative.   Eyes: Negative.   Respiratory: Negative.   Cardiovascular: Negative.   Gastrointestinal: Negative.   Endocrine: Negative.   Genitourinary: Negative.   Skin: Negative.   Allergic/Immunologic: Negative.   Neurological: Negative.   Hematological: Negative.   Psychiatric/Behavioral: Negative.     Per HPI unless specifically indicated above     Objective:    BP 134/85 (BP Location: Left Arm, Cuff Size: Normal)   Pulse (!) 121   Temp 98.4 F (36.9 C)   Ht 5'  2.8" (1.595 m)   Wt 149 lb 4.8 oz (67.7 kg)   LMP  (LMP Unknown)   SpO2 100%   BMI 26.62 kg/m   Wt Readings from Last 3 Encounters:  05/19/17 149 lb 4.8 oz (67.7 kg)  03/25/17 149 lb 6.4 oz (67.8 kg)  12/02/16 150 lb 11.2 oz (68.4 kg)    Physical Exam  Constitutional: She is oriented to person, place, and time. She appears well-developed and well-nourished. No distress.  HENT:  Head: Normocephalic and atraumatic.  Eyes: Conjunctivae and lids are normal. Right eye exhibits no discharge. Left eye exhibits no discharge. No scleral icterus.  Neck: Normal range of motion. Neck supple. No JVD present. Carotid bruit is not present.  Cardiovascular: Normal rate, regular rhythm and normal heart sounds.   Pulmonary/Chest: Effort normal and breath sounds normal.  Abdominal: Normal appearance. There is no splenomegaly or hepatomegaly.  Musculoskeletal: Normal range of motion.       Left knee: She exhibits normal  range of motion, no swelling, no effusion, no ecchymosis, no deformity, no erythema and no bony tenderness. No tenderness found.  Neurological: She is alert and oriented to person, place, and time.  Skin: Skin is warm, dry and intact. No rash noted. No pallor.  Psychiatric: She has a normal mood and affect. Her behavior is normal. Judgment and thought content normal.    Results for orders placed or performed in visit on 05/19/17  HM PAP SMEAR  Result Value Ref Range   HM Pap smear done at OBGYN per patient       Assessment & Plan:   Problem List Items Addressed This Visit      Unprioritized   Generalized anxiety disorder    Discussed counseling.  Beta blocker may be appropriate      White coat syndrome with hypertension    Pt's BP is high today.  At home it is OK.  DASH diet       Other Visit Diagnoses    Left knee pain, unspecified chronicity    -  Primary   Not sure of cause.  Will refer to Orthopedics   Relevant Orders   Ambulatory referral to Orthopedic Surgery   Annual physical exam       Relevant Orders   CBC with Differential/Platelet   Comprehensive metabolic panel   Lipid Panel w/o Chol/HDL Ratio   TSH   VITAMIN D 25 Hydroxy (Vit-D Deficiency, Fractures)       Follow up plan: Return if symptoms worsen or fail to improve.

## 2017-05-19 NOTE — Assessment & Plan Note (Signed)
Discussed counseling.  Beta blocker may be appropriate

## 2017-05-19 NOTE — Assessment & Plan Note (Signed)
Pt's BP is high today.  At home it is OK.  DASH diet

## 2017-05-20 LAB — CBC WITH DIFFERENTIAL/PLATELET
Basophils Absolute: 0 10*3/uL (ref 0.0–0.2)
Basos: 0 %
EOS (ABSOLUTE): 0.1 10*3/uL (ref 0.0–0.4)
EOS: 3 %
HEMATOCRIT: 40 % (ref 34.0–46.6)
HEMOGLOBIN: 13.5 g/dL (ref 11.1–15.9)
Immature Grans (Abs): 0 10*3/uL (ref 0.0–0.1)
Immature Granulocytes: 0 %
LYMPHS ABS: 1.3 10*3/uL (ref 0.7–3.1)
LYMPHS: 24 %
MCH: 30.9 pg (ref 26.6–33.0)
MCHC: 33.8 g/dL (ref 31.5–35.7)
MCV: 92 fL (ref 79–97)
MONOCYTES: 6 %
Monocytes Absolute: 0.3 10*3/uL (ref 0.1–0.9)
NEUTROS PCT: 67 %
Neutrophils Absolute: 3.5 10*3/uL (ref 1.4–7.0)
Platelets: 184 10*3/uL (ref 150–379)
RBC: 4.37 x10E6/uL (ref 3.77–5.28)
RDW: 13 % (ref 12.3–15.4)
WBC: 5.2 10*3/uL (ref 3.4–10.8)

## 2017-05-20 LAB — VITAMIN D 25 HYDROXY (VIT D DEFICIENCY, FRACTURES): VIT D 25 HYDROXY: 39.9 ng/mL (ref 30.0–100.0)

## 2017-05-20 LAB — COMPREHENSIVE METABOLIC PANEL
ALBUMIN: 4.4 g/dL (ref 3.5–5.5)
ALK PHOS: 36 IU/L — AB (ref 39–117)
ALT: 12 IU/L (ref 0–32)
AST: 15 IU/L (ref 0–40)
Albumin/Globulin Ratio: 1.6 (ref 1.2–2.2)
BUN / CREAT RATIO: 12 (ref 9–23)
BUN: 10 mg/dL (ref 6–20)
Bilirubin Total: 0.4 mg/dL (ref 0.0–1.2)
CO2: 23 mmol/L (ref 20–29)
CREATININE: 0.82 mg/dL (ref 0.57–1.00)
Calcium: 9.5 mg/dL (ref 8.7–10.2)
Chloride: 103 mmol/L (ref 96–106)
GFR calc non Af Amer: 97 mL/min/{1.73_m2} (ref >59)
GFR, EST AFRICAN AMERICAN: 112 mL/min/{1.73_m2} (ref >59)
GLOBULIN, TOTAL: 2.7 g/dL (ref 1.5–4.5)
Glucose: 91 mg/dL (ref 65–99)
Potassium: 4.5 mmol/L (ref 3.5–5.2)
SODIUM: 139 mmol/L (ref 134–144)
TOTAL PROTEIN: 7.1 g/dL (ref 6.0–8.5)

## 2017-05-20 LAB — LIPID PANEL W/O CHOL/HDL RATIO
CHOLESTEROL TOTAL: 184 mg/dL (ref 100–199)
HDL: 49 mg/dL (ref >39)
LDL CALC: 111 mg/dL — AB (ref 0–99)
Triglycerides: 119 mg/dL (ref 0–149)
VLDL Cholesterol Cal: 24 mg/dL (ref 5–40)

## 2017-05-20 LAB — TSH: TSH: 2.02 u[IU]/mL (ref 0.450–4.500)

## 2017-05-20 NOTE — Progress Notes (Signed)
Normal labs.  Pt notified through mychart

## 2017-07-14 ENCOUNTER — Ambulatory Visit (INDEPENDENT_AMBULATORY_CARE_PROVIDER_SITE_OTHER): Payer: BLUE CROSS/BLUE SHIELD

## 2017-07-14 DIAGNOSIS — Z23 Encounter for immunization: Secondary | ICD-10-CM

## 2017-09-08 ENCOUNTER — Ambulatory Visit: Payer: BLUE CROSS/BLUE SHIELD | Admitting: Urology

## 2017-09-08 NOTE — Progress Notes (Signed)
09/09/2017 10:21 AM   Sherry Park [birth year] [id number]  Referring provider: Kathrine Haddock, NP [address], [place] [address]  Chief Complaint  Patient presents with  . Hematuria    HPI: Patient is a 31 -year-old Caucasian female who presents today as a referral from Dr. Leafy Ro for microscopic hematuria.    Patient was found to have microscopic hematuria on 06/03/2017 with 10-50 RBC's/hpf.  Patient doesn't have a prior history of microscopic hematuria.    She does not have a prior history of recurrent urinary tract infections, nephrolithiasis, trauma to the genitourinary tract or malignancies of the genitourinary tract.  She does not have a family medical history of nephrolithiasis, malignancies of the genitourinary tract or hematuria.   Today, she is having symptoms of frequent urination and occasional nocturia.  Her UA today demonstrates 11-30 RBC's and many bacteria.    She is not experiencing any suprapubic pain, abdominal pain or flank pain. She denies any recent fevers, chills, nausea or vomiting.   No recent upper tract imaging or cystoscopy.    She is not a smoker.  She is not exposed to secondhand smoke.  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     PMH: Past Medical History:  Diagnosis Date  . Arrhythmia   . Headache    a. felt to be 2/2 Dostinex  . Hemorrhoids   . Microprolactinoma ([place])    a. on Dostinex; b. followed by Dr. Gabriel Carina, MD  . Ovarian cyst     Surgical History: Past Surgical History:  Procedure Laterality Date  . NO PAST SURGERIES     verified with patient- 09/09/17    Home Medications:  Allergies as of 09/09/2017      Reactions   Doxycycline Other (See Comments)   Thrush and rash   Latex Itching      Medication List        Accurate as of 09/09/17 10:21 AM. Always use your most recent med list.          cabergoline 0.5 MG tablet Commonly known as:  DOSTINEX Take 0.25 mg by mouth 2 (two) times a week.   multivitamin  tablet Take 1 tablet by mouth daily.   TRI-LO-MARZIA 0.18/0.215/0.25 MG-25 MCG tab Generic drug:  Norgestimate-Ethinyl Estradiol Triphasic Take 1 tablet by mouth daily.       Allergies:  Allergies  Allergen Reactions  . Doxycycline Other (See Comments)    Thrush and rash  . Latex Itching    Family History: Family History  Problem Relation Age of Onset  . Diabetes Mellitus II Mother   . Hypertension Mother   . Stroke Mother        disabling stroke from oral contraceptive pill use at 29 years of age, no family hx of CVA  . Kidney disease Father   . Hypertension Sister   . Congestive Heart Failure Maternal Grandmother   . Kidney disease Maternal Grandmother   . Cancer Paternal Grandfather        skin  . Hematuria Neg Hx   . Kidney cancer Neg Hx   . Prostate cancer Neg Hx   . Sickle cell trait Neg Hx   . Tuberculosis Neg Hx     Social History:  reports that  has never smoked. she has never used smokeless tobacco. She reports that she does not drink alcohol or use drugs.  ROS: UROLOGY Frequent Urination?: Yes Hard to postpone urination?: No Burning/pain with urination?: No  Get up at night to urinate?: Yes Leakage of urine?: No Urine stream starts and stops?: No Trouble starting stream?: No Do you have to strain to urinate?: No Blood in urine?: No Urinary tract infection?: No Sexually transmitted disease?: No Injury to kidneys or bladder?: No Painful intercourse?: Yes Weak stream?: No Currently pregnant?: No Vaginal bleeding?: No Last menstrual period?: 08/27/17  Gastrointestinal Nausea?: No Vomiting?: No Indigestion/heartburn?: No Diarrhea?: No Constipation?: No  Constitutional Fever: No Night sweats?: No Weight loss?: No Fatigue?: No  Skin Skin rash/lesions?: No Itching?: No  Eyes Blurred vision?: No Double vision?: No  Ears/Nose/Throat Sore throat?: No Sinus problems?: Yes  Hematologic/Lymphatic Swollen glands?: No Easy bruising?:  No  Cardiovascular Leg swelling?: No Chest pain?: No  Respiratory Cough?: No Shortness of breath?: No  Endocrine Excessive thirst?: No  Musculoskeletal Back pain?: No Joint pain?: No  Neurological Headaches?: No Dizziness?: Yes  Psychologic Depression?: No Anxiety?: Yes  Physical Exam: BP (!) 151/92   Pulse (!) 113   Ht 5\' 3"  (1.6 m)   Wt 149 lb 3.2 oz (67.7 kg)   BMI 26.43 kg/m   Constitutional: Well nourished. Alert and oriented, No acute distress. HEENT: [place] AT, moist mucus membranes. Trachea midline, no masses. Cardiovascular: No clubbing, cyanosis, or edema. Respiratory: Normal respiratory effort, no increased work of breathing. GI: Abdomen is soft, non tender, non distended, no abdominal masses. Liver and spleen not palpable.  No hernias appreciated.  Stool sample for occult testing is not indicated.   GU: No CVA tenderness.  No bladder fullness or masses.   Skin: No rashes, bruises or suspicious lesions. Lymph: No cervical or inguinal adenopathy. Neurologic: Grossly intact, no focal deficits, moving all 4 extremities. Psychiatric: Normal mood and affect.  Laboratory Data: Lab Results  Component Value Date   WBC 5.2 05/19/2017   HGB 13.5 05/19/2017   HCT 40.0 05/19/2017   MCV 92 05/19/2017   PLT 184 05/19/2017    Lab Results  Component Value Date   CREATININE 0.82 05/19/2017    Lab Results  Component Value Date   TSH 2.020 05/19/2017       Component Value Date/Time   CHOL 184 05/19/2017 0854   HDL 49 05/19/2017 0854   LDLCALC 111 (H) 05/19/2017 0854    Lab Results  Component Value Date   AST 15 05/19/2017   Lab Results  Component Value Date   ALT 12 05/19/2017    Urinalysis 11-30 RBC's.  Moderate bacteria.  See Epic.   Assessment & Plan:    1. Microscopic hematuria  - I explained to the patient that there are a number of causes that can be associated with blood in the urine, such as stones, UTI's, damage to the urinary tract  and/or cancer.  - At this time, I felt that the patient warranted further urologic evaluation.   The AUA guidelines state that a CT urogram is the preferred imaging study to evaluate hematuria.  - I explained to the patient that a contrast material will be injected into a vein and that in rare instances, an allergic reaction can result and may even life threatening   The patient denies any allergies to contrast, iodine and/or seafood and is not taking metformin.  - Her reproductive status is unknown at this time.  We will obtain a serum pregnancy test today.   - Following the imaging study,  I've recommended a cystoscopy. I described how this is performed, typically in an office setting with a flexible  cystoscope. We described the risks, benefits, and possible side effects, the most common of which is a minor amount of blood in the urine and/or burning which usually resolves in 24 to 48 hours.    - The patient had the opportunity to ask questions which were answered. Based upon this discussion, the patient is willing to proceed. Therefore, I've ordered: a CT Urogram and cystoscopy.  - The patient will return following all of the above for discussion of the results.   - UA  - Urine culture  - BUN + creatinine    - serum pregnancy test  - she would like to post pone these studies at this time until the urine culture results are available  - if urine culture is negative she will go forth with CTU and cystoscopy - if urine culture is positive will treat infection - recheck the UA and if blood still persists pursue CTU and cystoscopy   Return for pending urine culture culture results.  These notes generated with voice recognition software. I apologize for typographical errors.  Zara Council, [facility] [address], [place], [place] [address] [phone]

## 2017-09-09 ENCOUNTER — Encounter: Payer: Self-pay | Admitting: Urology

## 2017-09-09 ENCOUNTER — Ambulatory Visit: Payer: BLUE CROSS/BLUE SHIELD | Admitting: Urology

## 2017-09-09 VITALS — BP 151/92 | HR 113 | Ht 63.0 in | Wt 149.2 lb

## 2017-09-09 DIAGNOSIS — R3129 Other microscopic hematuria: Secondary | ICD-10-CM

## 2017-09-09 DIAGNOSIS — I499 Cardiac arrhythmia, unspecified: Secondary | ICD-10-CM

## 2017-09-09 DIAGNOSIS — R000 Tachycardia, unspecified: Secondary | ICD-10-CM | POA: Insufficient documentation

## 2017-09-09 LAB — URINALYSIS, COMPLETE
Bilirubin, UA: NEGATIVE
GLUCOSE, UA: NEGATIVE
KETONES UA: NEGATIVE
NITRITE UA: NEGATIVE
Protein, UA: NEGATIVE
Specific Gravity, UA: 1.025 (ref 1.005–1.030)
UUROB: 0.2 mg/dL (ref 0.2–1.0)
pH, UA: 6 (ref 5.0–7.5)

## 2017-09-09 LAB — MICROSCOPIC EXAMINATION: Epithelial Cells (non renal): >10 /hpf — ABNORMAL HIGH (ref 0–10)

## 2017-09-10 LAB — BUN+CREAT
BUN/Creatinine Ratio: 12 (ref 9–23)
BUN: 9 mg/dL (ref 6–20)
CREATININE: 0.73 mg/dL (ref 0.57–1.00)
GFR calc Af Amer: 129 mL/min/{1.73_m2} (ref >59)
GFR, EST NON AFRICAN AMERICAN: 112 mL/min/{1.73_m2} (ref >59)

## 2017-09-10 LAB — HCG, SERUM, QUALITATIVE: hCG,Beta Subunit,Qual,Serum: NEGATIVE m[IU]/mL (ref <6)

## 2017-09-11 LAB — CULTURE, URINE COMPREHENSIVE

## 2017-09-16 ENCOUNTER — Other Ambulatory Visit: Payer: Self-pay

## 2017-09-16 ENCOUNTER — Telehealth: Payer: Self-pay

## 2017-09-16 DIAGNOSIS — R3129 Other microscopic hematuria: Secondary | ICD-10-CM

## 2017-09-16 NOTE — Telephone Encounter (Signed)
-----   Message from Nori Riis, PA-C sent at 09/12/2017  7:41 AM EST ----- Please let Kiante know that her urine culture was negative.  I recommend that she pursue the CT Urogram at this time.

## 2017-09-16 NOTE — Telephone Encounter (Signed)
Pt saw letter on mychart. Pt called with questions. Pt stated that she saw that her ucx was negative but wanted to know if [facility] would treat her for a UTI anyway. Made pt aware with a negative urine culture then there is nothing to treat and means she does not have a UTI. Pt was upset and voiced understanding.

## 2017-09-16 NOTE — Telephone Encounter (Signed)
Will send a letter

## 2017-09-26 ENCOUNTER — Other Ambulatory Visit: Payer: Self-pay

## 2017-10-03 ENCOUNTER — Ambulatory Visit
Admission: RE | Admit: 2017-10-03 | Discharge: 2017-10-03 | Disposition: A | Discharge status: Home / Self Care | Payer: BLUE CROSS/BLUE SHIELD | Source: Ambulatory Visit | Attending: Urology | Admitting: Urology

## 2017-10-03 DIAGNOSIS — R3129 Other microscopic hematuria: Secondary | ICD-10-CM | POA: Diagnosis present

## 2017-10-03 MED ORDER — IOPAMIDOL (ISOVUE-300) INJECTION 61%
100.0000 mL | Freq: Once | INTRAVENOUS | Status: AC | PRN
  Administered 2017-10-03: 150 mL via INTRAVENOUS

## 2017-10-09 ENCOUNTER — Ambulatory Visit: Payer: BLUE CROSS/BLUE SHIELD | Admitting: Urology

## 2017-10-09 ENCOUNTER — Encounter: Payer: Self-pay | Admitting: Urology

## 2017-10-09 VITALS — BP 150/95 | HR 108 | Ht 63.0 in | Wt 150.0 lb

## 2017-10-09 DIAGNOSIS — R3129 Other microscopic hematuria: Secondary | ICD-10-CM

## 2017-10-09 LAB — URINALYSIS, COMPLETE
BILIRUBIN UA: NEGATIVE
Glucose, UA: NEGATIVE
Ketones, UA: NEGATIVE
Leukocytes, UA: NEGATIVE
Nitrite, UA: NEGATIVE
PH UA: 5.5 (ref 5.0–7.5)
PROTEIN UA: NEGATIVE
Specific Gravity, UA: 1.015 (ref 1.005–1.030)
UUROB: 0.2 mg/dL (ref 0.2–1.0)

## 2017-10-09 LAB — MICROSCOPIC EXAMINATION
BACTERIA UA: NONE SEEN
WBC, UA: NONE SEEN /hpf (ref 0–?)

## 2017-10-09 MED ORDER — CIPROFLOXACIN HCL 500 MG PO TABS
500.0000 mg | ORAL_TABLET | Freq: Once | ORAL | Status: AC
  Administered 2017-10-09: 500 mg via ORAL

## 2017-10-09 MED ORDER — LIDOCAINE HCL 2 % EX GEL
1.0000 "application " | Freq: Once | CUTANEOUS | Status: AC
  Administered 2017-10-09: 1 via URETHRAL

## 2017-10-09 NOTE — Progress Notes (Signed)
   10/09/17  CC:  Chief Complaint  Patient presents with  . Cysto    HPI: The patient is a 32 -year-old Caucasian female who presents today for completion of her microscopic hematuria.    Patient was found to have microscopic hematuria on 06/03/2017 with 10-50 RBC's/hpf.  Patient doesn't have a prior history of microscopic hematuria.  It is persistent on today's urine  She does not have a prior history of recurrent urinary tract infections, nephrolithiasis, trauma to the genitourinary tract or malignancies of the genitourinary tract.  She does not have a family medical history of nephrolithiasis, malignancies of the genitourinary tract or hematuria.   Today, she is having symptoms of frequent urination and occasional nocturia.  Her UA at last visit demonstrates 11-30 RBC's and many bacteria.  Urine culture was however negative.  She is not experiencing any suprapubic pain, abdominal pain or flank pain. She denies any recent fevers, chills, nausea or vomiting.   She is not a smoker.  She is not exposed to secondhand smoke.  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    CT urogram was negative for source of hematuria.   There were no vitals taken for this visit. NED. A&Ox3.   No respiratory distress   Abd soft, NT, ND Normal external genitalia with patent urethral meatus  Cystoscopy Procedure Note  Patient identification was confirmed, informed consent was obtained, and patient was prepped using Betadine solution.  Lidocaine jelly was administered per urethral meatus.    Preoperative abx where received prior to procedure.    Procedure: - Flexible cystoscope introduced, without any difficulty.   - Thorough search of the bladder revealed:    normal urethral meatus    normal urothelium    no stones    no ulcers     no tumors    no urethral polyps    no trabeculation  - Ureteral orifices were normal in position and appearance.  Post-Procedure: - Patient tolerated the  procedure well  Assessment/ Plan:  1. Microscopic hematuria -Negative work up. Repeat urinalysis in one year.  Nickie Retort, MD

## 2018-05-28 ENCOUNTER — Ambulatory Visit (INDEPENDENT_AMBULATORY_CARE_PROVIDER_SITE_OTHER): Payer: BLUE CROSS/BLUE SHIELD | Admitting: Family Medicine

## 2018-05-28 ENCOUNTER — Other Ambulatory Visit: Payer: Self-pay

## 2018-05-28 DIAGNOSIS — R29898 Other symptoms and signs involving the musculoskeletal system: Secondary | ICD-10-CM | POA: Diagnosis not present

## 2018-05-28 DIAGNOSIS — R110 Nausea: Secondary | ICD-10-CM | POA: Diagnosis not present

## 2018-05-28 DIAGNOSIS — R61 Generalized hyperhidrosis: Secondary | ICD-10-CM

## 2018-05-28 MED ORDER — SUCRALFATE 1 G PO TABS: 1.0000 g | ORAL_TABLET | Freq: Three times a day (TID) | ORAL | 1 refills | Status: DC

## 2018-05-28 MED ORDER — PANTOPRAZOLE SODIUM 40 MG PO TBEC: 40.0000 mg | DELAYED_RELEASE_TABLET | Freq: Every day | ORAL | 1 refills | Status: DC

## 2018-05-28 NOTE — Progress Notes (Signed)
There were no vitals taken for this visit.   Subjective:    Patient ID: Sherry Park, female    DOB: 1986-03-01, 32 y.o.   MRN: [id number]  HPI: [name] is a 32 y.o. female  Chief Complaint  Patient presents with  . Gas    Ongoing for about 1 month  . Nausea    Ongoing for for about 2 weeks. Went to the [facility] August 10th-15th   Nausea, belching x 1 month. One episode of vomiting. Taking TUMS and eating yogurt prn which does help some. Denies urinary sxs, fevers, chills, sweats, stool changes, abdominal pain, recent travel, new foods or medications.  TMJ clicking and soreness sxs since getting wisdom teeth out. Feels she's probably grinding her teeth more. Has been using heating pads with some relief.   Notes she sweats more than normal these days, especially in the heat. Concerned about her electrolytes. Tries to drink lots of fluids.   Past Medical History:  Diagnosis Date  . Arrhythmia   . Headache    a. felt to be 2/2 Dostinex  . Hemorrhoids   . Microprolactinoma ([place])    a. on Dostinex; b. followed by Dr. Gabriel Carina, MD  . Ovarian cyst    Social History   Socioeconomic History  . Marital status: Single    Spouse name: Not on file  . Number of children: Not on file  . Years of education: Not on file  . Highest education level: Not on file  Occupational History  . Not on file  Social Needs  . Financial resource strain: Not on file  . Food insecurity:    Worry: Not on file    Inability: Not on file  . Transportation needs:    Medical: Not on file    Non-medical: Not on file  Tobacco Use  . Smoking status: Never Smoker  . Smokeless tobacco: Never Used  Substance and Sexual Activity  . Alcohol use: No  . Drug use: No  . Sexual activity: Yes  Lifestyle  . Physical activity:    Days per week: Not on file    Minutes per session: Not on file  . Stress: Not on file  Relationships  . Social connections:    Talks on phone: Not on file    Gets  together: Not on file    Attends religious service: Not on file    Active member of club or organization: Not on file    Attends meetings of clubs or organizations: Not on file    Relationship status: Not on file  . Intimate partner violence:    Fear of current or ex partner: Not on file    Emotionally abused: Not on file    Physically abused: Not on file    Forced sexual activity: Not on file  Other Topics Concern  . Not on file  Social History Narrative  . Not on file    Relevant past medical, surgical, family and social history reviewed and updated as indicated. Interim medical history since our last visit reviewed. Allergies and medications reviewed and updated.  Review of Systems  Per HPI unless specifically indicated above     Objective:    There were no vitals taken for this visit.  Wt Readings from Last 3 Encounters:  10/09/17 150 lb (68 kg)  09/09/17 149 lb 3.2 oz (67.7 kg)  05/19/17 149 lb 4.8 oz (67.7 kg)    Physical Exam  Constitutional: She is oriented  to person, place, and time. She appears well-developed and well-nourished. No distress.  HENT:  Head: Atraumatic.  Eyes: Conjunctivae and EOM are normal.  Neck: Normal range of motion. Neck supple.  Cardiovascular: Normal rate, regular rhythm and normal heart sounds.  Pulmonary/Chest: Effort normal and breath sounds normal.  Abdominal: Soft. Bowel sounds are normal. She exhibits no distension and no mass. There is no tenderness. There is no guarding.  Neurological: She is alert and oriented to person, place, and time.  Skin: Skin is warm and dry.  Psychiatric: She has a normal mood and affect. Her behavior is normal.  Nursing note and vitals reviewed.   Results for orders placed or performed in visit on 56/31/49  Basic Metabolic Panel (BMET)  Result Value Ref Range   Glucose 86 65 - 99 mg/dL   BUN 11 6 - 20 mg/dL   Creatinine, Ser 0.84 0.57 - 1.00 mg/dL   GFR calc non Af Amer 94 >59 mL/min/1.73   GFR  calc Af Amer 108 >59 mL/min/1.73   BUN/Creatinine Ratio 13 9 - 23   Sodium 139 134 - 144 mmol/L   Potassium 4.2 3.5 - 5.2 mmol/L   Chloride 99 96 - 106 mmol/L   CO2 23 20 - 29 mmol/L   Calcium 9.8 8.7 - 10.2 mg/dL      Assessment & Plan:   Problem List Items Addressed This Visit    None    Visit Diagnoses    Nausea    -  Primary   Strongly suspect GERD/gastritis causing her sxs. Will start protonix and carafate and monitor closely for benefit. Dietary modifications reviewed   Sweating increase       Will get a BMP for reassurance. Continue to drink lots of fluids   Relevant Orders   Basic Metabolic Panel (BMET) (Completed)   TMJ click       Declines muscle relaxers, will continue heat to areas, start massage, mouthguard at night, ibuprofen prn       Follow up plan: Return if symptoms worsen or fail to improve.

## 2018-05-29 LAB — BASIC METABOLIC PANEL
BUN/Creatinine Ratio: 13 (ref 9–23)
BUN: 11 mg/dL (ref 6–20)
CALCIUM: 9.8 mg/dL (ref 8.7–10.2)
CHLORIDE: 99 mmol/L (ref 96–106)
CO2: 23 mmol/L (ref 20–29)
CREATININE: 0.84 mg/dL (ref 0.57–1.00)
GFR calc Af Amer: 108 mL/min/{1.73_m2} (ref >59)
GFR calc non Af Amer: 94 mL/min/{1.73_m2} (ref >59)
Glucose: 86 mg/dL (ref 65–99)
Potassium: 4.2 mmol/L (ref 3.5–5.2)
Sodium: 139 mmol/L (ref 134–144)

## 2018-05-31 NOTE — Patient Instructions (Signed)
Follow up as needed

## 2018-06-06 ENCOUNTER — Encounter: Payer: Self-pay | Admitting: Family Medicine

## 2018-06-10 ENCOUNTER — Encounter: Payer: Self-pay | Admitting: Physician Assistant

## 2018-06-10 ENCOUNTER — Ambulatory Visit (INDEPENDENT_AMBULATORY_CARE_PROVIDER_SITE_OTHER): Payer: BLUE CROSS/BLUE SHIELD | Admitting: Physician Assistant

## 2018-06-10 VITALS — BP 129/86 | HR 96 | Temp 98.4°F | Ht 63.0 in | Wt 141.6 lb

## 2018-06-10 DIAGNOSIS — F411 Generalized anxiety disorder: Secondary | ICD-10-CM | POA: Diagnosis not present

## 2018-06-10 MED ORDER — SERTRALINE HCL 50 MG PO TABS: 50.0000 mg | ORAL_TABLET | Freq: Every day | ORAL | 0 refills | Status: DC

## 2018-06-10 NOTE — Patient Instructions (Signed)

## 2018-06-10 NOTE — Progress Notes (Signed)
Subjective:    Patient ID: Sherry Park, female    DOB: June 29, 1986, 32 y.o.   MRN: [id number]  Sherry Park is a 32 y.o. 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 In relationship where she feels safe. Was seen two weeks ago in clinic for GI issues which she feels are related to anxiety because the prescribed medications have not worked. She reports recently she would get panicked on cruise to [place]. Threw up once after becoming sunburnt and then has been scared to eat anything since then. Does not endorse feeling down and depressed. Saw counselor when she was younger for social anxiety, says this didn't work. Went once per week for a couple of months, kept switching therapists. Patient denies feeling down or depressed, feels mostly scared.  Was worked up for dizziness previously by endocrinology who did not think this was related to her cabergoline and did mention that a trial of an anxiolytic would be appropriate. Patient wishes to try something today.  Social History   Tobacco Use  . Smoking status: Never Smoker  . Smokeless tobacco: Never Used  Substance Use Topics  . Alcohol use: No  . Drug use: No    Review of Systems Per HPI unless specifically indicated above     Objective:    BP 129/86 (BP Location: Left Arm, Cuff Size: Normal)   Pulse 96   Temp 98.4 F (36.9 C) (Oral)   Ht 5\' 3"  (1.6 m)   Wt 141 lb 9.6 oz (64.2 kg)   SpO2 97%   BMI 25.08 kg/m   Wt Readings from Last 3 Encounters:  06/10/18 141 lb 9.6 oz (64.2 kg)  10/09/17 150 lb (68 kg)  09/09/17 149 lb 3.2 oz (67.7 kg)    Physical Exam  Constitutional: She is oriented to person, place, and time. She appears well-developed and well-nourished.  Cardiovascular: Normal rate and regular rhythm.  Pulmonary/Chest: Effort normal  and breath sounds normal.  Neurological: She is alert and oriented to person, place, and time.  Skin: Skin is warm and dry.  Psychiatric: Her mood appears anxious. She exhibits a depressed mood.  Tearful in office today.     Office Visit from 06/10/2018 in [facility]  PHQ-9 Total Score  10     GAD 7 : Generalized Anxiety Score 06/10/2018 06/06/2016  Nervous, Anxious, on Edge 3 2  Control/stop worrying 3 2  Worry too much - different things 2 2  Trouble relaxing 2 2  Restless 2 2  Easily annoyed or irritable 1 1  Afraid - awful might happen 3 2  Total GAD 7 Score 16 13  Anxiety Difficulty Somewhat difficult Somewhat difficult     Results for orders placed or performed in visit on 83/66/29  Basic Metabolic Panel (BMET)  Result Value Ref Range   Glucose 86 65 - 99 mg/dL   BUN 11 6 - 20 mg/dL   Creatinine, Ser 0.84 0.57 - 1.00 mg/dL   GFR calc non Af Amer 94 >59 mL/min/1.73   GFR calc Af Amer 108 >59 mL/min/1.73   BUN/Creatinine Ratio 13 9 - 23   Sodium 139 134 - 144 mmol/L   Potassium 4.2 3.5 - 5.2 mmol/L   Chloride 99 96 - 106 mmol/L   CO2 23  20 - 29 mmol/L   Calcium 9.8 8.7 - 10.2 mg/dL      Assessment & Plan:  1. Generalized anxiety disorder  Longstanding untreated anxiety with recent worsening. Will start as below. Counseled on initial side effects. Will take 2-4 weeks to work. Follow up in one month. Highly encouraged to consider counseling.  - sertraline (ZOLOFT) 50 MG tablet; Take 1 tablet (50 mg total) by mouth daily.  Dispense: 90 tablet; Refill: 0    Follow up plan: Return in about 1 month (around 07/10/2018) for anxiety.  Carles Collet, PA-C [facility] 06/10/2018, 12:24 PM

## 2018-06-25 ENCOUNTER — Encounter: Payer: Self-pay | Admitting: Physician Assistant

## 2018-07-10 ENCOUNTER — Ambulatory Visit: Payer: BLUE CROSS/BLUE SHIELD | Admitting: Family Medicine

## 2018-07-10 ENCOUNTER — Encounter: Payer: Self-pay | Admitting: Family Medicine

## 2018-07-10 VITALS — BP 132/87 | HR 105 | Temp 98.7°F | Ht 63.0 in | Wt 138.0 lb

## 2018-07-10 DIAGNOSIS — Z23 Encounter for immunization: Secondary | ICD-10-CM | POA: Diagnosis not present

## 2018-07-10 DIAGNOSIS — F411 Generalized anxiety disorder: Secondary | ICD-10-CM | POA: Diagnosis not present

## 2018-07-10 DIAGNOSIS — R1013 Epigastric pain: Secondary | ICD-10-CM

## 2018-07-10 MED ORDER — SERTRALINE HCL 25 MG PO TABS: 25.0000 mg | ORAL_TABLET | Freq: Every day | ORAL | 1 refills | Status: DC

## 2018-07-10 MED ORDER — RANITIDINE HCL 150 MG PO TABS: 150.0000 mg | ORAL_TABLET | Freq: Two times a day (BID) | ORAL | 1 refills | Status: DC

## 2018-07-10 NOTE — Patient Instructions (Signed)

## 2018-07-10 NOTE — Progress Notes (Signed)
BP 132/87   Pulse (!) 105   Temp 98.7 F (37.1 C) (Oral)   Ht 5\' 3"  (1.6 m)   Wt 138 lb (62.6 kg)   SpO2 99%   BMI 24.45 kg/m    Subjective:    Patient ID: Sherry Park, female    DOB: [birth year], 32 y.o.   MRN: [id number]  HPI: Sherry Park is a 32 y.o. female  Chief Complaint  Patient presents with  . Anxiety    4 week f/up- pt states she has only been taking 1/2 tablet of sertraline because of side effects   Here today for 1 month f/u for anxiety after starting zoloft. Taking 1/2 tab (25 mg) daily. Feels it's helping her anxiety though she does still worry frequently. Having bloating side effects and now some loose stools but doesn't know if it's the new medicine or something else as her stomach has been messed up since several weeks prior to initiation. Still belching, having epigastric pain occasionally. No fevers, N/V, urinary sxs, diet changes, recent travel.   Relevant past medical, surgical, family and social history reviewed and updated as indicated. Interim medical history since our last visit reviewed. Allergies and medications reviewed and updated.  Review of Systems  Per HPI unless specifically indicated above     Objective:    BP 132/87   Pulse (!) 105   Temp 98.7 F (37.1 C) (Oral)   Ht 5\' 3"  (1.6 m)   Wt 138 lb (62.6 kg)   SpO2 99%   BMI 24.45 kg/m   Wt Readings from Last 3 Encounters:  07/10/18 138 lb (62.6 kg)  06/10/18 141 lb 9.6 oz (64.2 kg)  10/09/17 150 lb (68 kg)    Physical Exam  Constitutional: She is oriented to person, place, and time. She appears well-developed and well-nourished. No distress.  HENT:  Head: Atraumatic.  Eyes: Conjunctivae and EOM are normal.  Neck: Normal range of motion. Neck supple.  Cardiovascular: Normal rate and regular rhythm.  Pulmonary/Chest: Effort normal and breath sounds normal.  Abdominal: Soft. Bowel sounds are normal. She exhibits no distension. There is no tenderness. There is no guarding.    Musculoskeletal: Normal range of motion.  Neurological: She is alert and oriented to person, place, and time.  Skin: Skin is warm and dry.  Psychiatric: She has a normal mood and affect. Her behavior is normal.  Nursing note and vitals reviewed.   Results for orders placed or performed in visit on 62/26/33  Basic Metabolic Panel (BMET)  Result Value Ref Range   Glucose 86 65 - 99 mg/dL   BUN 11 6 - 20 mg/dL   Creatinine, Ser 0.84 0.57 - 1.00 mg/dL   GFR calc non Af Amer 94 >59 mL/min/1.73   GFR calc Af Amer 108 >59 mL/min/1.73   BUN/Creatinine Ratio 13 9 - 23   Sodium 139 134 - 144 mmol/L   Potassium 4.2 3.5 - 5.2 mmol/L   Chloride 99 96 - 106 mmol/L   CO2 23 20 - 29 mmol/L   Calcium 9.8 8.7 - 10.2 mg/dL      Assessment & Plan:   Problem List Items Addressed This Visit      Other   Generalized anxiety disorder    Improved on 25 mg zoloft, wanting to continue medication and see if GI issues clear up.       Relevant Medications   sertraline (ZOLOFT) 25 MG tablet    Other Visit Diagnoses  Epigastric pain    -  Primary   Will check h pylori and CBC, had side effects to protonix. now taking TUMS prn. Add zantac BID, diet modifications. F/u in 1 month. Return precautions reviewed   Relevant Orders   Helicobacter pylori abs-IgG+IgA, bld   CBC with Differential/Platelet   Need for influenza vaccination       Relevant Orders   Flu Vaccine QUAD 36+ mos IM (Completed)       Follow up plan: Return in about 4 weeks (around 08/07/2018) for anxiety.

## 2018-07-10 NOTE — Assessment & Plan Note (Signed)
Improved on 25 mg zoloft, wanting to continue medication and see if GI issues clear up.

## 2018-07-14 ENCOUNTER — Telehealth: Payer: Self-pay | Admitting: Family Medicine

## 2018-07-14 ENCOUNTER — Other Ambulatory Visit: Payer: Self-pay | Admitting: Family Medicine

## 2018-07-14 ENCOUNTER — Encounter: Payer: Self-pay | Admitting: Family Medicine

## 2018-07-14 LAB — CBC WITH DIFFERENTIAL/PLATELET
BASOS: 1 %
Basophils Absolute: 0 10*3/uL (ref 0.0–0.2)
EOS (ABSOLUTE): 0.1 10*3/uL (ref 0.0–0.4)
EOS: 2 %
HEMATOCRIT: 39.9 % (ref 34.0–46.6)
HEMOGLOBIN: 13.5 g/dL (ref 11.1–15.9)
Immature Grans (Abs): 0 10*3/uL (ref 0.0–0.1)
Immature Granulocytes: 0 %
LYMPHS: 23 %
Lymphocytes Absolute: 1.3 10*3/uL (ref 0.7–3.1)
MCH: 29.7 pg (ref 26.6–33.0)
MCHC: 33.8 g/dL (ref 31.5–35.7)
MCV: 88 fL (ref 79–97)
Monocytes Absolute: 0.4 10*3/uL (ref 0.1–0.9)
Monocytes: 6 %
NEUTROS PCT: 68 %
Neutrophils Absolute: 3.9 10*3/uL (ref 1.4–7.0)
Platelets: 195 10*3/uL (ref 150–450)
RBC: 4.54 x10E6/uL (ref 3.77–5.28)
RDW: 12.1 % — ABNORMAL LOW (ref 12.3–15.4)
WBC: 5.7 10*3/uL (ref 3.4–10.8)

## 2018-07-14 LAB — HELICOBACTER PYLORI ABS-IGG+IGA, BLD: H PYLORI IGG: 0.99 {index_val} — AB (ref 0.00–0.79)

## 2018-07-14 MED ORDER — AMOXICILL-CLARITHRO-LANSOPRAZ PO MISC: Freq: Two times a day (BID) | ORAL | 0 refills | Status: DC

## 2018-07-14 NOTE — Telephone Encounter (Signed)
Copied from [place] [id number]. Topic: Quick Communication - Rx Refill/Question >> Jul 14, 2018 12:46 PM Margot Ables wrote: Medication: amoxicillin-clarithromycin-lansoprazole [facility]) combo pack - copay for combo med is $160 - if each is ordered separately it should be significantly cheaper. Please advise.

## 2018-07-14 NOTE — Telephone Encounter (Signed)
Routing to provider who wrote RX.

## 2018-07-15 MED ORDER — CLARITHROMYCIN 500 MG PO TABS: 500.0000 mg | ORAL_TABLET | Freq: Two times a day (BID) | ORAL | 0 refills | Status: DC

## 2018-07-15 MED ORDER — AMOXICILLIN 500 MG PO TABS: 1000.0000 mg | ORAL_TABLET | Freq: Two times a day (BID) | ORAL | 0 refills | Status: AC

## 2018-07-15 MED ORDER — LANSOPRAZOLE 30 MG PO CPDR: 30.0000 mg | DELAYED_RELEASE_CAPSULE | Freq: Two times a day (BID) | ORAL | 0 refills | Status: DC

## 2018-07-15 NOTE — Telephone Encounter (Signed)
Rxs sent

## 2018-07-29 ENCOUNTER — Encounter: Payer: Self-pay | Admitting: Family Medicine

## 2018-07-29 ENCOUNTER — Ambulatory Visit: Payer: BLUE CROSS/BLUE SHIELD | Admitting: Family Medicine

## 2018-07-29 VITALS — BP 122/78 | HR 87 | Temp 97.9°F | Wt 132.0 lb

## 2018-07-29 DIAGNOSIS — B370 Candidal stomatitis: Secondary | ICD-10-CM

## 2018-07-29 DIAGNOSIS — N83299 Other ovarian cyst, unspecified side: Secondary | ICD-10-CM | POA: Insufficient documentation

## 2018-07-29 DIAGNOSIS — R102 Pelvic and perineal pain: Secondary | ICD-10-CM | POA: Insufficient documentation

## 2018-07-29 DIAGNOSIS — Z8742 Personal history of other diseases of the female genital tract: Secondary | ICD-10-CM | POA: Insufficient documentation

## 2018-07-29 MED ORDER — NYSTATIN 100000 UNIT/ML MT SUSP: 5.0000 mL | Freq: Four times a day (QID) | OROMUCOSAL | 0 refills | Status: DC

## 2018-07-29 MED ORDER — FLUCONAZOLE 150 MG PO TABS: 150.0000 mg | ORAL_TABLET | Freq: Once | ORAL | 0 refills | Status: AC

## 2018-07-29 NOTE — Progress Notes (Signed)
BP 122/78   Pulse 87   Temp 97.9 F (36.6 C) (Oral)   Wt 132 lb (59.9 kg)   SpO2 99%   BMI 23.38 kg/m    Subjective:    Patient ID: Sherry Park, female    DOB: 03/01/1986, 32 y.o.   MRN: [id number]  HPI: AYLENE Park is a 32 y.o. female  Chief Complaint  Patient presents with  . Thrush   Here today for white coating on tongue noticed the past few days after completing a prev-pak for h pylori. No sore throat, fevers, tongue soreness, taste changes. Not trying anything OTC for sxs.   Relevant past medical, surgical, family and social history reviewed and updated as indicated. Interim medical history since our last visit reviewed. Allergies and medications reviewed and updated.  Review of Systems  Per HPI unless specifically indicated above     Objective:    BP 122/78   Pulse 87   Temp 97.9 F (36.6 C) (Oral)   Wt 132 lb (59.9 kg)   SpO2 99%   BMI 23.38 kg/m   Wt Readings from Last 3 Encounters:  07/29/18 132 lb (59.9 kg)  07/10/18 138 lb (62.6 kg)  06/10/18 141 lb 9.6 oz (64.2 kg)    Physical Exam  Constitutional: She is oriented to person, place, and time. She appears well-developed and well-nourished. No distress.  HENT:  Head: Atraumatic.  Mouth/Throat: Oropharynx is clear and moist.  White coating on tongue  Eyes: Conjunctivae and EOM are normal.  Neck: Normal range of motion. Neck supple.  Cardiovascular: Normal rate and regular rhythm.  Pulmonary/Chest: Effort normal and breath sounds normal.  Musculoskeletal: Normal range of motion.  Neurological: She is alert and oriented to person, place, and time.  Skin: Skin is warm and dry.  Psychiatric: She has a normal mood and affect. Her behavior is normal.  Nursing note and vitals reviewed.   Results for orders placed or performed in visit on 72/53/66  Helicobacter pylori abs-IgG+IgA, bld  Result Value Ref Range   H. pylori, IgA Abs <9.0 0.0 - 8.9 units   H. pylori, IgG AbS 0.99 (H) 0.00 -  0.79 Index Value  CBC with Differential/Platelet  Result Value Ref Range   WBC 5.7 3.4 - 10.8 x10E3/uL   RBC 4.54 3.77 - 5.28 x10E6/uL   Hemoglobin 13.5 11.1 - 15.9 g/dL   Hematocrit 39.9 34.0 - 46.6 %   MCV 88 79 - 97 fL   MCH 29.7 26.6 - 33.0 pg   MCHC 33.8 31.5 - 35.7 g/dL   RDW 12.1 (L) 12.3 - 15.4 %   Platelets 195 150 - 450 x10E3/uL   Neutrophils 68 Not Estab. %   Lymphs 23 Not Estab. %   Monocytes 6 Not Estab. %   Eos 2 Not Estab. %   Basos 1 Not Estab. %   Neutrophils Absolute 3.9 1.4 - 7.0 x10E3/uL   Lymphocytes Absolute 1.3 0.7 - 3.1 x10E3/uL   Monocytes Absolute 0.4 0.1 - 0.9 x10E3/uL   EOS (ABSOLUTE) 0.1 0.0 - 0.4 x10E3/uL   Basophils Absolute 0.0 0.0 - 0.2 x10E3/uL   Immature Granulocytes 0 Not Estab. %   Immature Grans (Abs) 0.0 0.0 - 0.1 x10E3/uL      Assessment & Plan:   Problem List Items Addressed This Visit    None    Visit Diagnoses    Thrush, oral    -  Primary   Tx with probiotics, nystatin mouthwash. Monitor  for improvement   Relevant Medications   nystatin (MYCOSTATIN) 100000 UNIT/ML suspension       Follow up plan: Return if symptoms worsen or fail to improve.

## 2018-08-01 NOTE — Patient Instructions (Signed)
Follow up as needed

## 2018-08-07 ENCOUNTER — Ambulatory Visit: Payer: BLUE CROSS/BLUE SHIELD | Admitting: Family Medicine

## 2018-08-07 ENCOUNTER — Encounter: Payer: Self-pay | Admitting: Family Medicine

## 2018-08-07 VITALS — BP 119/80 | HR 76 | Temp 98.6°F | Ht 63.0 in | Wt 136.2 lb

## 2018-08-07 DIAGNOSIS — R1010 Upper abdominal pain, unspecified: Secondary | ICD-10-CM

## 2018-08-07 DIAGNOSIS — F411 Generalized anxiety disorder: Secondary | ICD-10-CM | POA: Diagnosis not present

## 2018-08-07 DIAGNOSIS — B370 Candidal stomatitis: Secondary | ICD-10-CM | POA: Diagnosis not present

## 2018-08-07 MED ORDER — OMEPRAZOLE 40 MG PO CPDR: 40.0000 mg | DELAYED_RELEASE_CAPSULE | Freq: Every day | ORAL | 1 refills | Status: DC

## 2018-08-07 MED ORDER — NYSTATIN 100000 UNIT/ML MT SUSP: 5.0000 mL | Freq: Four times a day (QID) | OROMUCOSAL | 0 refills | Status: DC

## 2018-08-07 NOTE — Progress Notes (Signed)
BP 119/80   Pulse 76   Temp 98.6 F (37 C) (Oral)   Ht 5\' 3"  (1.6 m)   Wt 136 lb 3.2 oz (61.8 kg)   SpO2 98%   BMI 24.13 kg/m    Subjective:    Patient ID: Sherry Park, female    DOB: 01/25/1986, 32 y.o.   MRN: [id number]  HPI: Sherry Park is a 32 y.o. female  Chief Complaint  Patient presents with  . Depression    pt states she has been tapering off the sertraline  . Thrush    pt states she thinks she needs a refill on the nystatin, states her tongue is still white   Here today for anxiety f/u. Feels like the zoloft is making her feel bloated. Has been tapering down the last few days and feeling better with that so wants to come off altogether. Noticed a benefit with her anxiety but not enough to tolerate side effects.   Still having intermittent issues with white coating on tongue. Improved with nystatin suspension. Wanting refill today. No sore throat. Not currently using probiotics, just eating yogurt.   Completed the prev pak for h pylori, feeling like her indigestion/upper abdominal pain sxs are improved but still having some belching. Not currently taking anything for GERD. Wants to be retested to see if infection is gone.   Past Medical History:  Diagnosis Date  . Arrhythmia   . Headache    a. felt to be 2/2 Dostinex  . Hemorrhoids   . Microprolactinoma ([place])    a. on Dostinex; b. followed by Dr. Gabriel Carina, MD  . Ovarian cyst    GAD 7 : Generalized Anxiety Score 08/07/2018 07/10/2018 06/10/2018 06/06/2016  Nervous, Anxious, on Edge 0 1 3 2   Control/stop worrying 0 1 3 2   Worry too much - different things 1 1 2 2   Trouble relaxing 0 1 2 2   Restless 0 0 2 2  Easily annoyed or irritable 0 0 1 1  Afraid - awful might happen 1 1 3 2   Total GAD 7 Score 2 5 16 13   Anxiety Difficulty Not difficult at all Not difficult at all Somewhat difficult Somewhat difficult     Relevant past medical, surgical, family and social history reviewed and updated as indicated.  Interim medical history since our last visit reviewed. Allergies and medications reviewed and updated.  Review of Systems  Per HPI unless specifically indicated above     Objective:    BP 119/80   Pulse 76   Temp 98.6 F (37 C) (Oral)   Ht 5\' 3"  (1.6 m)   Wt 136 lb 3.2 oz (61.8 kg)   SpO2 98%   BMI 24.13 kg/m   Wt Readings from Last 3 Encounters:  08/07/18 136 lb 3.2 oz (61.8 kg)  07/29/18 132 lb (59.9 kg)  07/10/18 138 lb (62.6 kg)    Physical Exam  Constitutional: She is oriented to person, place, and time. She appears well-developed and well-nourished. No distress.  HENT:  Head: Atraumatic.  Mouth/Throat: Oropharynx is clear and moist.  White coating across tongue  Eyes: Conjunctivae and EOM are normal.  Neck: Normal range of motion.  Cardiovascular: Normal rate and regular rhythm.  Pulmonary/Chest: Effort normal and breath sounds normal.  Abdominal: Soft. Bowel sounds are normal. She exhibits no distension. There is no tenderness.  Musculoskeletal: Normal range of motion.  Neurological: She is alert and oriented to person, place, and time.  Skin: Skin is  warm and dry.  Psychiatric: She has a normal mood and affect. Her behavior is normal.  Nursing note and vitals reviewed.   Results for orders placed or performed in visit on 20/25/42  Helicobacter pylori abs-IgG+IgA, bld  Result Value Ref Range   H. pylori, IgA Abs <9.0 0.0 - 8.9 units   H. pylori, IgG AbS 0.99 (H) 0.00 - 0.79 Index Value  CBC with Differential/Platelet  Result Value Ref Range   WBC 5.7 3.4 - 10.8 x10E3/uL   RBC 4.54 3.77 - 5.28 x10E6/uL   Hemoglobin 13.5 11.1 - 15.9 g/dL   Hematocrit 39.9 34.0 - 46.6 %   MCV 88 79 - 97 fL   MCH 29.7 26.6 - 33.0 pg   MCHC 33.8 31.5 - 35.7 g/dL   RDW 12.1 (L) 12.3 - 15.4 %   Platelets 195 150 - 450 x10E3/uL   Neutrophils 68 Not Estab. %   Lymphs 23 Not Estab. %   Monocytes 6 Not Estab. %   Eos 2 Not Estab. %   Basos 1 Not Estab. %   Neutrophils  Absolute 3.9 1.4 - 7.0 x10E3/uL   Lymphocytes Absolute 1.3 0.7 - 3.1 x10E3/uL   Monocytes Absolute 0.4 0.1 - 0.9 x10E3/uL   EOS (ABSOLUTE) 0.1 0.0 - 0.4 x10E3/uL   Basophils Absolute 0.0 0.0 - 0.2 x10E3/uL   Immature Granulocytes 0 Not Estab. %   Immature Grans (Abs) 0.0 0.0 - 0.1 x10E3/uL      Assessment & Plan:   Problem List Items Addressed This Visit      Other   Generalized anxiety disorder - Primary    Continue taper off zoloft as side effects are greater than benefit for pt. She does not want to try anything else at this time. Recommended counseling, follow up if sxs worsening       Other Visit Diagnoses    Pain of upper abdomen       Will check H pylori IGM per pt request. Symptomatically improved, but will restart PPIs for protection given still belching   Relevant Orders   HELICOBACTER PYLORI  ANTIBODY, IGM   Thrush       Refill nystatin, start probiotics 1-2 times daily. F/u if not fully resolving.    Relevant Medications   nystatin (MYCOSTATIN) 100000 UNIT/ML suspension       Follow up plan: Return for CPE.

## 2018-08-10 NOTE — Patient Instructions (Signed)
Follow up for CPE 

## 2018-08-10 NOTE — Assessment & Plan Note (Signed)
Continue taper off zoloft as side effects are greater than benefit for pt. She does not want to try anything else at this time. Recommended counseling, follow up if sxs worsening

## 2018-08-11 LAB — HELICOBACTER PYLORI  ANTIBODY, IGM: H pylori, IgM Abs: 10.8 units — ABNORMAL HIGH (ref 0.0–8.9)

## 2018-08-12 ENCOUNTER — Other Ambulatory Visit: Payer: Self-pay | Admitting: Family Medicine

## 2018-08-12 ENCOUNTER — Encounter: Payer: Self-pay | Admitting: Family Medicine

## 2018-08-12 DIAGNOSIS — R140 Abdominal distension (gaseous): Secondary | ICD-10-CM

## 2018-08-19 ENCOUNTER — Encounter: Payer: Self-pay | Admitting: Gastroenterology

## 2018-08-19 ENCOUNTER — Ambulatory Visit (INDEPENDENT_AMBULATORY_CARE_PROVIDER_SITE_OTHER): Payer: BLUE CROSS/BLUE SHIELD | Admitting: Gastroenterology

## 2018-08-19 VITALS — BP 135/81 | HR 91 | Resp 17 | Ht 63.0 in | Wt 134.6 lb

## 2018-08-19 DIAGNOSIS — R768 Other specified abnormal immunological findings in serum: Secondary | ICD-10-CM

## 2018-08-19 DIAGNOSIS — R1013 Epigastric pain: Secondary | ICD-10-CM | POA: Diagnosis not present

## 2018-08-19 NOTE — Progress Notes (Signed)
Cephas Darby, MD [address]  [place]  [place], [place] [address]  Main: [phone]  Fax: [phone]    Gastroenterology Consultation  Referring Provider:     Volney American,* Primary Care Physician:  Sherry Haddock, NP Primary Gastroenterologist:  Dr. Cephas Darby Reason for Consultation:     Dyspepsia        HPI:   Sherry Park is a 32 y.o. female referred by Dr. Kathrine Haddock, NP  for consultation & management of chronic dyspepsia.  Patient has been experiencing nausea, early satiety, bloating, burping since July, got worse in August.  Due to persistent symptoms, she was empirically treated for H. pylori infection with triple therapy based on positive serologies.  Her symptoms improved but continues to have early satiety associated with some bloating and burping. She just started taking probiotics which seems to help with bloating.  She denies weight loss, diarrhea.  She denies any other GI symptoms.  Currently, not on PPI  NSAIDs: None  Antiplts/Anticoagulants/Anti thrombotics: None  GI Procedures: None She denies family history of GI malignancy She does not smoke or drink alcohol Denies any GI surgeries  Past Medical History:  Diagnosis Date  . Arrhythmia   . Headache    a. felt to be 2/2 Dostinex  . Hemorrhoids   . Microprolactinoma ([place])    a. on Dostinex; b. followed by Dr. Gabriel Carina, MD  . Ovarian cyst     Past Surgical History:  Procedure Laterality Date  . NO PAST SURGERIES     verified with patient- 09/09/17    Prior to Admission medications   Medication Sig Start Date End Date Taking? Authorizing Provider  cabergoline (DOSTINEX) 0.5 MG tablet Take 0.25 mg by mouth once a week. 03/10/18  Yes [provider]  ERRIN 0.35 MG tablet Take 1 tablet by mouth daily. 06/02/18  Yes [provider]  nystatin (MYCOSTATIN) 100000 UNIT/ML suspension Take 5 mLs (500,000 Units total) by mouth 4 (four) times daily. Patient not  taking: Reported on 08/19/2018 08/07/18   Volney American, PA-C  omeprazole (PRILOSEC) 40 MG capsule Take 1 capsule (40 mg total) by mouth daily. Patient not taking: Reported on 08/19/2018 08/07/18   Volney American, PA-C    Family History  Problem Relation Age of Onset  . Diabetes Mellitus II Mother   . Hypertension Mother   . Stroke Mother        disabling stroke from oral contraceptive pill use at 30 years of age, no family hx of CVA  . Kidney disease Father   . Hypertension Sister   . Congestive Heart Failure Maternal Grandmother   . Kidney disease Maternal Grandmother   . Cancer Paternal Grandfather        skin  . Hematuria Neg Hx   . Kidney cancer Neg Hx   . Prostate cancer Neg Hx   . Sickle cell trait Neg Hx   . Tuberculosis Neg Hx      Social History   Tobacco Use  . Smoking status: Never Smoker  . Smokeless tobacco: Never Used  Substance Use Topics  . Alcohol use: No  . Drug use: No    Allergies as of 08/19/2018 - Review Complete 08/19/2018  Allergen Reaction Noted  . Doxycycline Other (See Comments) 06/03/2017  . Pantoprazole Other (See Comments) 07/10/2018  . Latex Itching 07/01/2016    Review of Systems:    All systems reviewed and negative except where noted in HPI.  Physical Exam:  BP 135/81 (BP Location: Left Arm, Patient Position: Sitting, Cuff Size: Normal)   Pulse 91   Resp 17   Ht 5\' 3"  (1.6 m)   Wt 134 lb 9.6 oz (61.1 kg)   BMI 23.84 kg/m   No LMP recorded.  General:   Alert,  Well-developed, well-nourished, pleasant and cooperative in NAD Head:  Normocephalic and atraumatic. Eyes:  Sclera clear, no icterus.   Conjunctiva pink. Ears:  Normal auditory acuity. Nose:  No deformity, discharge, or lesions. Mouth:  No deformity or lesions,oropharynx pink & moist. Neck:  Supple; no masses or thyromegaly. Lungs:  Respirations even and unlabored.  Clear throughout to auscultation.   No wheezes, crackles, or rhonchi. No acute  distress. Heart:  Regular rate and rhythm; no murmurs, clicks, rubs, or gallops. Abdomen:  Normal bowel sounds. Soft, non-tender and non-distended without masses, hepatosplenomegaly or hernias noted.  No guarding or rebound tenderness.   Rectal: Not performed Msk:  Symmetrical without gross deformities. Good, equal movement & strength bilaterally. Pulses:  Normal pulses noted. Extremities:  No clubbing or edema.  No cyanosis. Neurologic:  Alert and oriented x3;  grossly normal neurologically. Skin:  Intact without significant lesions or rashes. No jaundice. Lymph Nodes:  No significant cervical adenopathy. Psych:  Alert and cooperative. Normal mood and affect.  Imaging Studies: None  Assessment and Plan:   [name] is a 32 y.o. Caucasian female with symptoms consistent with dyspepsia for the last 3 months, empirically treated with triple therapy for positive H. pylori serologies, modestly improved  -I discussed with patient about performing upper endoscopy but she would like to wait for 1 to 2 months as she just started taking probiotics and see if that would help -Perform H. pylori breath test today and treat if positive -Trial of FD guard -Discussed with her about continuing probiotics.  She tried align Gummies which made bloating worse Currently she is taking over-the-counter probiotics.  Advised her about Culturelle, Florastor and VSL #3   Follow up in 2 months   Cephas Darby, MD

## 2018-08-21 ENCOUNTER — Encounter: Payer: Self-pay | Admitting: Gastroenterology

## 2018-08-21 LAB — H. PYLORI BREATH TEST: H PYLORI BREATH TEST: NEGATIVE

## 2018-08-28 NOTE — Telephone Encounter (Signed)
Pt is calling to see  If the lab order is ready and if she needs to come into our office for the blood work

## 2018-09-01 ENCOUNTER — Ambulatory Visit: Payer: Self-pay

## 2018-09-01 NOTE — Telephone Encounter (Signed)
Pt. Reports she has been having dizziness on and off x 2 weeks. Mainly when she is walking and she turns her head, feels off balance. Has been seeing GI doctor for "stomach issues." "I've lost weight and noticed my  BP runs low sometimes - 92/73, 107/71." Instructed to stay hydrated and eat well. Appointment made for Monday. Instructed if symptoms worsen to call back or go to ED. Verbalizes understanding. Reason for Disposition . [1] MODERATE dizziness (e.g., interferes with normal activities) AND [2] has NOT been evaluated by physician for this  (Exception: dizziness caused by heat exposure, sudden standing, or poor fluid intake)  Answer Assessment - Initial Assessment Questions 1. DESCRIPTION: "Describe your dizziness."     Lightheaded  2. LIGHTHEADED: "Do you feel lightheaded?" (e.g., somewhat faint, woozy, weak upon standing)     Off balance - when up walking and turns head 3. VERTIGO: "Do you feel like either you or the room is spinning or tilting?" (i.e. vertigo)     No 4. SEVERITY: "How bad is it?"  "Do you feel like you are going to faint?" "Can you stand and walk?"   - MILD - walking normally   - MODERATE - interferes with normal activities (e.g., work, school)    - SEVERE - unable to stand, requires support to walk, feels like passing out now.      Mild 5. ONSET:  "When did the dizziness begin?"     2 Weeks ago 6. AGGRAVATING FACTORS: "Does anything make it worse?" (e.g., standing, change in head position)     Moving head 7. HEART RATE: "Can you tell me your heart rate?" "How many beats in 15 seconds?"  (Note: not all patients can do this)       Some skipped heart rates 8. CAUSE: "What do you think is causing the dizziness?"     Unsure 9. RECURRENT SYMPTOM: "Have you had dizziness before?" If so, ask: "When was the last time?" "What happened that time?"     No 10. OTHER SYMPTOMS: "Do you have any other symptoms?" (e.g., fever, chest pain, vomiting, diarrhea, bleeding)        No 11. PREGNANCY: "Is there any chance you are pregnant?" "When was your last menstrual period?"       No  Protocols used: DIZZINESS [facility]

## 2018-09-02 ENCOUNTER — Other Ambulatory Visit: Payer: Self-pay

## 2018-09-02 ENCOUNTER — Telehealth: Payer: Self-pay | Admitting: Gastroenterology

## 2018-09-02 DIAGNOSIS — R5383 Other fatigue: Secondary | ICD-10-CM

## 2018-09-02 DIAGNOSIS — E559 Vitamin D deficiency, unspecified: Secondary | ICD-10-CM

## 2018-09-02 NOTE — Progress Notes (Signed)
Labs ordered, pt notified.

## 2018-09-02 NOTE — Telephone Encounter (Signed)
Pt has been notified.

## 2018-09-02 NOTE — Telephone Encounter (Signed)
Pt returning the dr's call. Pt says it was probably in reference to her needing blood work. Pls call patient

## 2018-09-07 ENCOUNTER — Encounter: Payer: Self-pay | Admitting: Family Medicine

## 2018-09-07 ENCOUNTER — Ambulatory Visit (INDEPENDENT_AMBULATORY_CARE_PROVIDER_SITE_OTHER): Payer: BLUE CROSS/BLUE SHIELD | Admitting: Family Medicine

## 2018-09-07 VITALS — BP 135/86 | HR 115 | Temp 98.8°F | Ht 63.0 in | Wt 132.1 lb

## 2018-09-07 DIAGNOSIS — R209 Unspecified disturbances of skin sensation: Secondary | ICD-10-CM

## 2018-09-07 DIAGNOSIS — R197 Diarrhea, unspecified: Secondary | ICD-10-CM

## 2018-09-07 DIAGNOSIS — R140 Abdominal distension (gaseous): Secondary | ICD-10-CM | POA: Diagnosis not present

## 2018-09-07 DIAGNOSIS — R6889 Other general symptoms and signs: Secondary | ICD-10-CM | POA: Diagnosis not present

## 2018-09-07 DIAGNOSIS — R42 Dizziness and giddiness: Secondary | ICD-10-CM | POA: Diagnosis not present

## 2018-09-07 NOTE — Progress Notes (Signed)
BP 135/86 (BP Location: Right Arm, Patient Position: Sitting, Cuff Size: Normal)   Pulse (!) 115   Temp 98.8 F (37.1 C)   Ht 5\' 3"  (1.6 m)   Wt 132 lb 2 oz (59.9 kg)   LMP 08/31/2018 (Approximate)   SpO2 100%   BMI 23.40 kg/m    Subjective:    Patient ID: Sherry Park, female    DOB: 09-Oct-1985, 32 y.o.   MRN: [id number]  HPI: Sherry Park is a 32 y.o. female  Chief Complaint  Patient presents with  . Dizziness    Patient states that when she checked her BP it was 90's/70's, but can be 100's/80's  . Hypotension  . Hand Problem    cold hands   Here today with concerns about intermittent dizziness the past few weeks. Has been checking her BPs and they've been running low (90s/70s on average) during these episodes. Trying to drink enough water and stay well nourished. Does have frequent diarrhea for which she is seeing GI about currently.   Has been dealing with cold extremities and blue discoloration of toes (worse on the left) the past few months. Not painful, just concerning to her. No hx of autoimmune issues, thyroid issues, circulation issues.   Wanting to be tested for B12 and celiac dz. Started taking B12 the past few days and is feeling some better.   Past Medical History:  Diagnosis Date  . Arrhythmia   . Headache    a. felt to be 2/2 Dostinex  . Hemorrhoids   . Microprolactinoma ([place])    a. on Dostinex; b. followed by Dr. Gabriel Carina, MD  . Ovarian cyst    Social History   Socioeconomic History  . Marital status: Single    Spouse name: Not on file  . Number of children: Not on file  . Years of education: Not on file  . Highest education level: Not on file  Occupational History  . Not on file  Social Needs  . Financial resource strain: Not on file  . Food insecurity:    Worry: Not on file    Inability: Not on file  . Transportation needs:    Medical: Not on file    Non-medical: Not on file  Tobacco Use  . Smoking status: Never Smoker  .  Smokeless tobacco: Never Used  Substance and Sexual Activity  . Alcohol use: No  . Drug use: No  . Sexual activity: Yes  Lifestyle  . Physical activity:    Days per week: Not on file    Minutes per session: Not on file  . Stress: Not on file  Relationships  . Social connections:    Talks on phone: Not on file    Gets together: Not on file    Attends religious service: Not on file    Active member of club or organization: Not on file    Attends meetings of clubs or organizations: Not on file    Relationship status: Not on file  . Intimate partner violence:    Fear of current or ex partner: Not on file    Emotionally abused: Not on file    Physically abused: Not on file    Forced sexual activity: Not on file  Other Topics Concern  . Not on file  Social History Narrative  . Not on file   Relevant past medical, surgical, family and social history reviewed and updated as indicated. Interim medical history since our last visit reviewed.  Allergies and medications reviewed and updated.  Review of Systems  Per HPI unless specifically indicated above     Objective:    BP 135/86 (BP Location: Right Arm, Patient Position: Sitting, Cuff Size: Normal)   Pulse (!) 115   Temp 98.8 F (37.1 C)   Ht 5\' 3"  (1.6 m)   Wt 132 lb 2 oz (59.9 kg)   LMP 08/31/2018 (Approximate)   SpO2 100%   BMI 23.40 kg/m   Wt Readings from Last 3 Encounters:  09/07/18 132 lb 2 oz (59.9 kg)  08/19/18 134 lb 9.6 oz (61.1 kg)  08/07/18 136 lb 3.2 oz (61.8 kg)    Physical Exam  Constitutional: She is oriented to person, place, and time. She appears well-developed and well-nourished. No distress.  HENT:  Head: Atraumatic.  Eyes: Pupils are equal, round, and reactive to light. Conjunctivae and EOM are normal.  Neck: Normal range of motion. Neck supple.  Cardiovascular: Normal rate, regular rhythm and normal heart sounds.  Pulmonary/Chest: Effort normal and breath sounds normal.  Abdominal: Soft. Bowel  sounds are normal. There is no tenderness.  Musculoskeletal: Normal range of motion. She exhibits no edema or tenderness.  Neurological: She is alert and oriented to person, place, and time. No cranial nerve deficit. Coordination normal.  Skin: Skin is warm and dry. No erythema.  Psychiatric: She has a normal mood and affect. Her behavior is normal.  Nursing note and vitals reviewed.   Results for orders placed or performed in visit on 08/19/18  H. pylori breath test  Result Value Ref Range   H pylori Breath Test Negative Negative      Assessment & Plan:   Problem List Items Addressed This Visit    None    Visit Diagnoses    Dizziness    -  Primary   Orthostatics WNL today, but suspect hydration causing her sxs. Push fluids, increase sodium intake. Continue to monitor closely   Cold extremities       Suspect Raynaud's, but can't trial CCB given hypotensive episodes. Keep extremities warm and covered and continue to monitor. Pt declines autoimmune testing   Bloating       Working with GI, who placed the labs pt requested several days ago. Pt to go to [facility] or GI clinic to get those drawn   Diarrhea, unspecified type       Following with GI, will get labs drawn that were ordered by them a few days ago for further investigation and stay well hydrated in meantime       Follow up plan: Return for CPE.

## 2018-09-09 LAB — GLIA (IGA/G) + TTG IGA
Antigliadin Abs, IgA: 6 units (ref 0–19)
Gliadin IgG: 4 units (ref 0–19)
Transglutaminase IgA: <2 U/mL (ref 0–3)

## 2018-09-09 LAB — VITAMIN D 25 HYDROXY (VIT D DEFICIENCY, FRACTURES): VIT D 25 HYDROXY: 30.7 ng/mL (ref 30.0–100.0)

## 2018-09-09 LAB — VITAMIN B12: Vitamin B-12: 639 pg/mL (ref 232–1245)

## 2018-09-15 ENCOUNTER — Encounter: Payer: Self-pay | Admitting: Gastroenterology

## 2018-09-15 ENCOUNTER — Other Ambulatory Visit: Payer: Self-pay

## 2018-09-15 ENCOUNTER — Ambulatory Visit (INDEPENDENT_AMBULATORY_CARE_PROVIDER_SITE_OTHER): Payer: BLUE CROSS/BLUE SHIELD | Admitting: Gastroenterology

## 2018-09-15 VITALS — BP 126/79 | HR 91 | Resp 17 | Ht 63.0 in | Wt 132.2 lb

## 2018-09-15 DIAGNOSIS — R5382 Chronic fatigue, unspecified: Secondary | ICD-10-CM | POA: Diagnosis not present

## 2018-09-15 DIAGNOSIS — R1013 Epigastric pain: Secondary | ICD-10-CM

## 2018-09-15 NOTE — Progress Notes (Signed)
Cephas Darby, MD [address]  [place]  [place], [place] [address]  Main: [phone]  Fax: [phone]    Gastroenterology Consultation  Referring Provider:     Kathrine Haddock, NP Primary Care Physician:  Volney American, PA-C Primary Gastroenterologist:  Dr. Cephas Darby Reason for Consultation:     Dyspepsia        HPI:   Sherry Park is a 32 y.o. female referred by Dr. Orene Desanctis, Lilia Argue, PA-C  for consultation & management of chronic dyspepsia.  Patient has been experiencing nausea, early satiety, bloating, burping since July, got worse in August.  Due to persistent symptoms, she was empirically treated for H. pylori infection with triple therapy based on positive serologies.  Her symptoms improved but continues to have early satiety associated with some bloating and burping. She just started taking probiotics which seems to help with bloating.  She denies weight loss, diarrhea.  She denies any other GI symptoms.  Currently, not on PPI  Follow-up visit 09/15/2018 Her symptoms are persistent and would like to get an EGD.  She noticed that eating carbs particularly are making her symptoms worse.  She continues to lose weight.  She lost about 16 pounds since 05/2018.  She tried FD guard which provided some symptomatic relief.  She continues to report fatigue.  Her H. pylori breath test was negative, B12 normal,  serum TTG IgA negative.  Unknown total IgA levels.  Vitamin D levels normal.  She does not report any anxiety, stress or depression  NSAIDs: None  Antiplts/Anticoagulants/Anti thrombotics: None  GI Procedures: None She denies family history of GI malignancy She does not smoke or drink alcohol Denies any GI surgeries  Past Medical History:  Diagnosis Date  . Arrhythmia   . Headache    a. felt to be 2/2 Dostinex  . Hemorrhoids   . Microprolactinoma ([place])    a. on Dostinex; b. followed by Dr. Gabriel Carina, MD  . Ovarian cyst     Past Surgical  History:  Procedure Laterality Date  . NO PAST SURGERIES     verified with patient- 09/09/17    Current Outpatient Medications:  .  cabergoline (DOSTINEX) 0.5 MG tablet, Take 0.25 mg by mouth once a week., Disp: , Rfl:  .  ERRIN 0.35 MG tablet, Take 1 tablet by mouth daily., Disp: , Rfl: 3   Family History  Problem Relation Age of Onset  . Diabetes Mellitus II Mother   . Hypertension Mother   . Stroke Mother        disabling stroke from oral contraceptive pill use at 30 years of age, no family hx of CVA  . Kidney disease Father   . Hypertension Sister   . Congestive Heart Failure Maternal Grandmother   . Kidney disease Maternal Grandmother   . Cancer Paternal Grandfather        skin  . Hematuria Neg Hx   . Kidney cancer Neg Hx   . Prostate cancer Neg Hx   . Sickle cell trait Neg Hx   . Tuberculosis Neg Hx      Social History   Tobacco Use  . Smoking status: Never Smoker  . Smokeless tobacco: Never Used  Substance Use Topics  . Alcohol use: No  . Drug use: No    Allergies as of 09/15/2018 - Review Complete 09/15/2018  Allergen Reaction Noted  . Doxycycline Other (See Comments) 06/03/2017  . Pantoprazole Other (See Comments) 07/10/2018  . Latex  Itching 07/01/2016    Review of Systems:    All systems reviewed and negative except where noted in HPI.   Physical Exam:  BP 126/79 (BP Location: Left Arm, Patient Position: Sitting, Cuff Size: Normal)   Pulse 91   Resp 17   Ht 5\' 3"  (1.6 m)   Wt 132 lb 3.2 oz (60 kg)   LMP 08/31/2018 (Approximate)   BMI 23.42 kg/m  Patient's last menstrual period was 08/31/2018 (approximate).  General:   Alert,  Well-developed, well-nourished, pleasant and cooperative in NAD Head:  Normocephalic and atraumatic. Eyes:  Sclera clear, no icterus.   Conjunctiva pink. Ears:  Normal auditory acuity. Nose:  No deformity, discharge, or lesions. Mouth:  No deformity or lesions,oropharynx pink & moist. Neck:  Supple; no masses or  thyromegaly. Lungs:  Respirations even and unlabored.  Clear throughout to auscultation.   No wheezes, crackles, or rhonchi. No acute distress. Heart:  Regular rate and rhythm; no murmurs, clicks, rubs, or gallops. Abdomen:  Normal bowel sounds. Soft, non-tender and non-distended without masses, hepatosplenomegaly or hernias noted.  No guarding or rebound tenderness.   Rectal: Not performed Msk:  Symmetrical without gross deformities. Good, equal movement & strength bilaterally. Pulses:  Normal pulses noted. Extremities:  No clubbing or edema.  No cyanosis. Neurologic:  Alert and oriented x3;  grossly normal neurologically. Skin:  Intact without significant lesions or rashes. No jaundice. Lymph Nodes:  No significant cervical adenopathy. Psych:  Alert and cooperative. Normal mood and affect.  Imaging Studies: None  Assessment and Plan:   Sherry Park is a 33 y.o. Caucasian female with symptoms consistent with dyspepsia for the last 3 months, empirically treated with triple therapy for positive H. pylori serologies, modestly improved associated with significant weight loss  -Perform EGD with gastric and duodenal biopsies -Continue FD guard as needed -Check ferritin, iron and TIBC, total IgA and folate levels   Follow up in 2 months   Cephas Darby, MD

## 2018-09-17 LAB — FERRITIN: Ferritin: 21 ng/mL (ref 15–150)

## 2018-09-17 LAB — IGA: IgA/Immunoglobulin A, Serum: 167 mg/dL (ref 87–352)

## 2018-09-17 LAB — IRON AND TIBC
Iron Saturation: 26 % (ref 15–55)
Iron: 95 ug/dL (ref 27–159)
Total Iron Binding Capacity: 360 ug/dL (ref 250–450)
UIBC: 265 ug/dL (ref 131–425)

## 2018-09-17 LAB — FOLATE

## 2018-09-21 ENCOUNTER — Encounter: Payer: Self-pay | Admitting: Gastroenterology

## 2018-09-24 ENCOUNTER — Encounter: Payer: Self-pay | Admitting: *Deleted

## 2018-09-24 ENCOUNTER — Other Ambulatory Visit: Payer: Self-pay | Admitting: Gastroenterology

## 2018-09-24 ENCOUNTER — Ambulatory Visit: Payer: BLUE CROSS/BLUE SHIELD | Admitting: Registered Nurse

## 2018-09-24 ENCOUNTER — Encounter
Admission: RE | Disposition: A | Discharge status: Home / Self Care | Payer: Self-pay | Source: Home / Self Care | Attending: Gastroenterology

## 2018-09-24 ENCOUNTER — Ambulatory Visit
Admission: RE | Admit: 2018-09-24 | Discharge: 2018-09-24 | Disposition: A | Discharge status: Home / Self Care | Payer: BLUE CROSS/BLUE SHIELD | Attending: Gastroenterology | Admitting: Gastroenterology

## 2018-09-24 DIAGNOSIS — R1013 Epigastric pain: Secondary | ICD-10-CM | POA: Diagnosis not present

## 2018-09-24 DIAGNOSIS — F411 Generalized anxiety disorder: Secondary | ICD-10-CM

## 2018-09-24 DIAGNOSIS — K317 Polyp of stomach and duodenum: Secondary | ICD-10-CM | POA: Diagnosis not present

## 2018-09-24 DIAGNOSIS — K30 Functional dyspepsia: Secondary | ICD-10-CM

## 2018-09-24 DIAGNOSIS — Z79899 Other long term (current) drug therapy: Secondary | ICD-10-CM | POA: Insufficient documentation

## 2018-09-24 DIAGNOSIS — K2950 Unspecified chronic gastritis without bleeding: Secondary | ICD-10-CM | POA: Diagnosis not present

## 2018-09-24 DIAGNOSIS — R634 Abnormal weight loss: Secondary | ICD-10-CM | POA: Diagnosis not present

## 2018-09-24 HISTORY — PX: ESOPHAGOGASTRODUODENOSCOPY (EGD) WITH PROPOFOL: SHX5813

## 2018-09-24 LAB — POCT PREGNANCY, URINE: Preg Test, Ur: NEGATIVE

## 2018-09-24 SURGERY — ESOPHAGOGASTRODUODENOSCOPY (EGD) WITH PROPOFOL
Anesthesia: General

## 2018-09-24 MED ORDER — AMITRIPTYLINE HCL 25 MG PO TABS: 25.0000 mg | ORAL_TABLET | Freq: Every day | ORAL | 2 refills | Status: DC

## 2018-09-24 MED ORDER — MIDAZOLAM HCL 2 MG/2ML IJ SOLN
INTRAMUSCULAR | Status: DC | PRN
  Administered 2018-09-24: 2 mg via INTRAVENOUS

## 2018-09-24 MED ORDER — MIDAZOLAM HCL 2 MG/2ML IJ SOLN
INTRAMUSCULAR | Status: AC
  Filled 2018-09-24: qty 2

## 2018-09-24 MED ORDER — PROPOFOL 500 MG/50ML IV EMUL
INTRAVENOUS | Status: AC
  Filled 2018-09-24: qty 50

## 2018-09-24 MED ORDER — LIDOCAINE HCL (CARDIAC) PF 100 MG/5ML IV SOSY
PREFILLED_SYRINGE | INTRAVENOUS | Status: DC | PRN
  Administered 2018-09-24: 60 mg via INTRAVENOUS

## 2018-09-24 MED ORDER — PROPOFOL 10 MG/ML IV BOLUS
INTRAVENOUS | Status: DC | PRN
  Administered 2018-09-24: 100 mg via INTRAVENOUS

## 2018-09-24 MED ORDER — ONDANSETRON HCL 4 MG/2ML IJ SOLN
INTRAMUSCULAR | Status: DC | PRN
  Administered 2018-09-24: 4 mg via INTRAVENOUS

## 2018-09-24 MED ORDER — SODIUM CHLORIDE 0.9 % IV SOLN
INTRAVENOUS | Status: DC
  Administered 2018-09-24: 1000 mL via INTRAVENOUS

## 2018-09-24 MED ORDER — PROPOFOL 500 MG/50ML IV EMUL
INTRAVENOUS | Status: DC | PRN
  Administered 2018-09-24: 150 ug/kg/min via INTRAVENOUS

## 2018-09-24 NOTE — H&P (Signed)
Cephas Darby, MD [address]  [place]  [place], [place] [address]  Main: [phone]  Fax: [phone] Pager: [phone]  Primary Care Physician:  Volney American, PA-C Primary Gastroenterologist:  Dr. Cephas Darby  Pre-Procedure History & Physical: HPI:  Sherry Park is a 32 y.o. female is here for an endoscopy.   Past Medical History:  Diagnosis Date  . Arrhythmia   . Headache    a. felt to be 2/2 Dostinex  . Hemorrhoids   . Microprolactinoma ([place])    a. on Dostinex; b. followed by Dr. Gabriel Carina, MD  . Ovarian cyst     Past Surgical History:  Procedure Laterality Date  . NO PAST SURGERIES     verified with patient- 09/09/17    Prior to Admission medications   Medication Sig Start Date End Date Taking? Authorizing Provider  cabergoline (DOSTINEX) 0.5 MG tablet Take 0.25 mg by mouth once a week. 03/10/18  Yes [provider]  ERRIN 0.35 MG tablet Take 1 tablet by mouth daily. 06/02/18  Yes [provider]    Allergies as of 09/15/2018 - Review Complete 09/15/2018  Allergen Reaction Noted  . Doxycycline Other (See Comments) 06/03/2017  . Pantoprazole Other (See Comments) 07/10/2018  . Latex Itching 07/01/2016    Family History  Problem Relation Age of Onset  . Diabetes Mellitus II Mother   . Hypertension Mother   . Stroke Mother        disabling stroke from oral contraceptive pill use at 32 years of age, no family hx of CVA  . Kidney disease Father   . Hypertension Sister   . Congestive Heart Failure Maternal Grandmother   . Kidney disease Maternal Grandmother   . Cancer Paternal Grandfather        skin  . Hematuria Neg Hx   . Kidney cancer Neg Hx   . Prostate cancer Neg Hx   . Sickle cell trait Neg Hx   . Tuberculosis Neg Hx     Social History   Socioeconomic History  . Marital status: Single    Spouse name: Not on file  . Number of children: Not on file  . Years of education: Not on file  . Highest education  level: Not on file  Occupational History  . Not on file  Social Needs  . Financial resource strain: Not on file  . Food insecurity:    Worry: Not on file    Inability: Not on file  . Transportation needs:    Medical: Not on file    Non-medical: Not on file  Tobacco Use  . Smoking status: Never Smoker  . Smokeless tobacco: Never Used  Substance and Sexual Activity  . Alcohol use: No  . Drug use: No  . Sexual activity: Yes  Lifestyle  . Physical activity:    Days per week: Not on file    Minutes per session: Not on file  . Stress: Not on file  Relationships  . Social connections:    Talks on phone: Not on file    Gets together: Not on file    Attends religious service: Not on file    Active member of club or organization: Not on file    Attends meetings of clubs or organizations: Not on file    Relationship status: Not on file  . Intimate partner violence:    Fear of current or ex partner: Not on file    Emotionally abused: Not on file  Physically abused: Not on file    Forced sexual activity: Not on file  Other Topics Concern  . Not on file  Social History Narrative  . Not on file    Review of Systems: See HPI, otherwise negative ROS  Physical Exam: BP (!) 136/97   Pulse (!) 115   Temp 97.9 F (36.6 C) (Tympanic)   Resp 16   LMP 08/31/2018 (Approximate)   SpO2 100%  General:   Alert,  pleasant and cooperative in NAD Head:  Normocephalic and atraumatic. Neck:  Supple; no masses or thyromegaly. Lungs:  Clear throughout to auscultation.    Heart:  Regular rate and rhythm. Abdomen:  Soft, nontender and nondistended. Normal bowel sounds, without guarding, and without rebound.   Neurologic:  Alert and  oriented x4;  grossly normal neurologically.  Impression/Plan: Sherry Park is here for an endoscopy to be performed for dyspepsia  Risks, benefits, limitations, and alternatives regarding  endoscopy have been reviewed with the patient.  Questions have  been answered.  All parties agreeable.   Sherri Sear, MD  09/24/2018, 10:31 AM

## 2018-09-24 NOTE — Transfer of Care (Signed)
Immediate Anesthesia Transfer of Care Note  Patient: Sherry Park  Procedure(s) Performed: ESOPHAGOGASTRODUODENOSCOPY (EGD) WITH PROPOFOL (N/A )  Patient Location: PACU and Endoscopy Unit  Anesthesia Type:General  Level of Consciousness: alert   Airway & Oxygen Therapy: Patient Spontanous Breathing  Post-op Assessment: Report given to RN  Post vital signs: stable  Last Vitals:  Vitals Value Taken Time  BP 93/55 09/24/2018 11:55 AM  Temp 36.2 C 09/24/2018 11:55 AM  Pulse 104 09/24/2018 11:55 AM  Resp 13 09/24/2018 11:55 AM  SpO2 99 % 09/24/2018 11:55 AM    Last Pain:  Vitals:   09/24/18 1155  TempSrc: Tympanic  PainSc: 0-No pain      Patients Stated Pain Goal: 0 (20/10/07 1219)  Complications: No apparent anesthesia complications

## 2018-09-24 NOTE — Anesthesia Post-op Follow-up Note (Signed)
Anesthesia QCDR form completed.        

## 2018-09-24 NOTE — Op Note (Signed)
[facility]astroenter[facility] Patient Name: Sherry Park Procedure Date: 09/24/2018 11:30 AM MRN: [id number] Account #: [id number] Date of Birth: [birth year] Admit Type: Outpatient Age: 32 Room: [facility] ENDO ROOM 2 Gender: Female Note Status: Finalized Procedure:            Upper GI endoscopy Indications:          Dyspepsia, Weight loss Providers:            Lin Landsman MD, MD Referring MD:         Volney American (Referring MD) Medicines:            Monitored Anesthesia Care Complications:        No immediate complications. Estimated blood loss:                        Minimal. Procedure:            Pre-Anesthesia Assessment:                       - Prior to the procedure, a History and Physical was                        performed, and patient medications and allergies were                        reviewed. The patient is competent. The risks and                        benefits of the procedure and the sedation options and                        risks were discussed with the patient. All questions                        were answered and informed consent was obtained.                        Patient identification and proposed procedure were                        verified by the physician, the nurse, the                        anesthesiologist, the anesthetist and the technician in                        the pre-procedure area in the procedure room in the                        endoscopy suite. Mental Status Examination: alert and                        oriented. Airway Examination: normal oropharyngeal                        airway and neck mobility. Respiratory Examination:                        clear to auscultation. CV Examination: normal.  Prophylactic Antibiotics: The patient does not require                        prophylactic antibiotics. Prior Anticoagulants: The                        patient has taken no previous  anticoagulant or                        antiplatelet agents. ASA Grade Assessment: II - A                        patient with mild systemic disease. After reviewing the                        risks and benefits, the patient was deemed in                        satisfactory condition to undergo the procedure. The                        anesthesia plan was to use monitored anesthesia care                        (MAC). Immediately prior to administration of                        medications, the patient was re-assessed for adequacy                        to receive sedatives. The heart rate, respiratory rate,                        oxygen saturations, blood pressure, adequacy of                        pulmonary ventilation, and response to care were                        monitored throughout the procedure. The physical status                        of the patient was re-assessed after the procedure.                       After obtaining informed consent, the endoscope was                        passed under direct vision. Throughout the procedure,                        the patient's blood pressure, pulse, and oxygen                        saturations were monitored continuously. The Endoscope                        was introduced through the mouth, and advanced to the  second part of duodenum. The upper GI endoscopy was                        accomplished without difficulty. The patient tolerated                        the procedure well. Findings:      The duodenal bulb and second portion of the duodenum were normal.       Biopsies for histology were taken with a cold forceps for evaluation of       celiac disease.      A few 2 to 4 mm sessile polyps with no bleeding and no stigmata of       recent bleeding were found in the gastric body. The polyp was removed       with forceps for sampling      The entire examined stomach was normal. Biopsies were taken with a  cold       forceps for Helicobacter pylori testing.      The cardia and gastric fundus were normal on retroflexion.      The gastroesophageal junction and examined esophagus were normal. Impression:           - Normal duodenal bulb and second portion of the                        duodenum. Biopsied.                       - A few gastric polyps.                       - Normal stomach. Biopsied.                       - Normal gastroesophageal junction and esophagus. Recommendation:       - Discharge patient to home (with parent).                       - Resume regular diet.                       - Continue present medications.                       - Await pathology results.                       - Return to my office as previously scheduled.                       - Trial of TCA                       - Will evaluate for food allergy Procedure Code(s):    --- Professional ---                       [phone], Esophagogastroduodenoscopy, flexible, transoral;                        with biopsy, single or multiple Diagnosis Code(s):    --- Professional ---  K31.7, Polyp of stomach and duodenum                       R10.13, Epigastric pain                       R63.4, Abnormal weight loss CPT copyright 2018 American Medical Association. All rights reserved. The codes documented in this report are preliminary and upon coder review may  be revised to meet current compliance requirements. Dr. Ulyess Mort Lin Landsman MD, MD 09/24/2018 11:52:28 AM This report has been signed electronically. Number of Addenda: 0 Note Initiated On: 09/24/2018 11:30 AM      [facility]

## 2018-09-24 NOTE — Anesthesia Preprocedure Evaluation (Addendum)
Anesthesia Evaluation  Patient identified by MRN, date of birth, ID band Patient awake    Reviewed: Allergy & Precautions, H&P , NPO status , Patient's Chart, lab work & pertinent test results  History of Anesthesia Complications (+) Family history of anesthesia reaction and history of anesthetic complications (strong family hx of PONV)  Airway Mallampati: II       Dental  (+) Teeth Intact   Pulmonary neg pulmonary ROS,           Cardiovascular hypertension,      Neuro/Psych  Headaches, PSYCHIATRIC DISORDERS Anxiety    GI/Hepatic negative GI ROS, Neg liver ROS,   Endo/Other  negative endocrine ROS  Renal/GU negative Renal ROS  negative genitourinary   Musculoskeletal   Abdominal   Peds  Hematology negative hematology ROS (+)   Anesthesia Other Findings Past Medical History: No date: Arrhythmia No date: Headache     Comment:  a. felt to be 2/2 Dostinex No date: Hemorrhoids No date: Microprolactinoma [facility])     Comment:  a. on Dostinex; b. followed by Dr. Gabriel Carina, MD No date: Ovarian cyst  Past Surgical History: No date: NO PAST SURGERIES     Comment:  verified with patient- 09/09/17     Reproductive/Obstetrics negative OB ROS                            Anesthesia Physical Anesthesia Plan  ASA: II  Anesthesia Plan: General   Post-op Pain Management:    Induction:   PONV Risk Score and Plan: Propofol infusion, TIVA, Midazolam and Ondansetron  Airway Management Planned:   Additional Equipment:   Intra-op Plan:   Post-operative Plan:   Informed Consent: I have reviewed the patients History and Physical, chart, labs and discussed the procedure including the risks, benefits and alternatives for the proposed anesthesia with the patient or authorized representative who has indicated his/her understanding and acceptance.   Dental Advisory Given  Plan Discussed with:  Anesthesiologist, CRNA and Surgeon  Anesthesia Plan Comments:        Anesthesia Quick Evaluation

## 2018-09-25 ENCOUNTER — Encounter: Payer: Self-pay | Admitting: Gastroenterology

## 2018-09-25 ENCOUNTER — Ambulatory Visit: Payer: BLUE CROSS/BLUE SHIELD

## 2018-09-25 LAB — SURGICAL PATHOLOGY

## 2018-09-25 NOTE — Anesthesia Postprocedure Evaluation (Signed)
Anesthesia Post Note  Patient: Sherry Park  Procedure(s) Performed: ESOPHAGOGASTRODUODENOSCOPY (EGD) WITH PROPOFOL (N/A )  Patient location during evaluation: PACU Anesthesia Type: General Level of consciousness: awake and alert Pain management: pain level controlled Vital Signs Assessment: post-procedure vital signs reviewed and stable Respiratory status: spontaneous breathing, nonlabored ventilation and respiratory function stable Cardiovascular status: blood pressure returned to baseline and stable Postop Assessment: no apparent nausea or vomiting Anesthetic complications: no     Last Vitals:  Vitals:   09/24/18 1215 09/24/18 1225  BP: 110/71 119/76  Pulse: (!) 102 (!) 126  Resp: 13 13  Temp:    SpO2: 100% 100%    Last Pain:  Vitals:   09/24/18 1225  TempSrc:   PainSc: 0-No pain                 Durenda Hurt

## 2018-10-09 ENCOUNTER — Encounter: Payer: Self-pay | Admitting: Gastroenterology

## 2018-10-20 ENCOUNTER — Other Ambulatory Visit: Payer: Self-pay

## 2018-10-20 ENCOUNTER — Ambulatory Visit: Payer: BLUE CROSS/BLUE SHIELD | Admitting: Gastroenterology

## 2018-10-20 DIAGNOSIS — K30 Functional dyspepsia: Secondary | ICD-10-CM

## 2018-10-22 LAB — FOOD ALLERGY PROFILE
Allergen Corn, IgE: <0.1 kU/L
Clam IgE: <0.1 kU/L
Codfish IgE: <0.1 kU/L
Egg White IgE: <0.1 kU/L
Milk IgE: <0.1 kU/L
Peanut IgE: <0.1 kU/L
Scallop IgE: <0.1 kU/L
Sesame Seed IgE: <0.1 kU/L
Shrimp IgE: <0.1 kU/L
Soybean IgE: <0.1 kU/L
Wheat IgE: <0.1 kU/L

## 2018-10-23 ENCOUNTER — Encounter: Payer: Self-pay | Admitting: Gastroenterology

## 2018-10-23 LAB — ALPHA-GAL PANEL
Beef (Bos spp) IgE: <0.1 kU/L (ref <0.35)
Class Interpretation: 0
Class Interpretation: 0
Class Interpretation: 0
Lamb/Mutton (Ovis spp) IgE: <0.1 kU/L (ref <0.35)
Pork (Sus spp) IgE: <0.1 kU/L (ref <0.35)

## 2018-10-26 ENCOUNTER — Other Ambulatory Visit: Payer: Self-pay | Admitting: Gastroenterology

## 2018-10-26 DIAGNOSIS — K580 Irritable bowel syndrome with diarrhea: Secondary | ICD-10-CM

## 2018-10-26 MED ORDER — RIFAXIMIN 550 MG PO TABS: 550.0000 mg | ORAL_TABLET | Freq: Two times a day (BID) | ORAL | 0 refills | Status: AC

## 2018-10-30 ENCOUNTER — Encounter: Payer: Self-pay | Admitting: Gastroenterology

## 2018-11-02 ENCOUNTER — Emergency Department
Admission: EM | Admit: 2018-11-02 | Discharge: 2018-11-02 | Disposition: A | Discharge status: Home / Self Care | Payer: BLUE CROSS/BLUE SHIELD | Attending: Emergency Medicine | Admitting: Emergency Medicine

## 2018-11-02 ENCOUNTER — Emergency Department: Payer: BLUE CROSS/BLUE SHIELD

## 2018-11-02 ENCOUNTER — Encounter: Payer: Self-pay | Admitting: Emergency Medicine

## 2018-11-02 ENCOUNTER — Other Ambulatory Visit: Payer: Self-pay

## 2018-11-02 DIAGNOSIS — Z79899 Other long term (current) drug therapy: Secondary | ICD-10-CM | POA: Diagnosis not present

## 2018-11-02 DIAGNOSIS — R110 Nausea: Secondary | ICD-10-CM | POA: Diagnosis not present

## 2018-11-02 DIAGNOSIS — Z91040 Latex allergy status: Secondary | ICD-10-CM | POA: Diagnosis not present

## 2018-11-02 LAB — LIPASE, BLOOD: Lipase: 38 U/L (ref 11–51)

## 2018-11-02 LAB — COMPREHENSIVE METABOLIC PANEL
ALT: 16 U/L (ref 0–44)
AST: 16 U/L (ref 15–41)
Albumin: 4.7 g/dL (ref 3.5–5.0)
Alkaline Phosphatase: 41 U/L (ref 38–126)
Anion gap: 9 (ref 5–15)
BUN: 12 mg/dL (ref 6–20)
CHLORIDE: 104 mmol/L (ref 98–111)
CO2: 27 mmol/L (ref 22–32)
CREATININE: 0.7 mg/dL (ref 0.44–1.00)
Calcium: 10 mg/dL (ref 8.9–10.3)
GFR calc Af Amer: >60 mL/min (ref >60)
GFR calc non Af Amer: >60 mL/min (ref >60)
Glucose, Bld: 108 mg/dL — ABNORMAL HIGH (ref 70–99)
POTASSIUM: 4.1 mmol/L (ref 3.5–5.1)
Sodium: 140 mmol/L (ref 135–145)
Total Bilirubin: 1.2 mg/dL (ref 0.3–1.2)
Total Protein: 8.3 g/dL — ABNORMAL HIGH (ref 6.5–8.1)

## 2018-11-02 LAB — CBC
HEMATOCRIT: 43.8 % (ref 36.0–46.0)
Hemoglobin: 14.4 g/dL (ref 12.0–15.0)
MCH: 30.1 pg (ref 26.0–34.0)
MCHC: 32.9 g/dL (ref 30.0–36.0)
MCV: 91.6 fL (ref 80.0–100.0)
Platelets: 154 10*3/uL (ref 150–400)
RBC: 4.78 MIL/uL (ref 3.87–5.11)
RDW: 12.8 % (ref 11.5–15.5)
WBC: 5.8 10*3/uL (ref 4.0–10.5)
nRBC: 0 % (ref 0.0–0.2)

## 2018-11-02 LAB — URINALYSIS, COMPLETE (UACMP) WITH MICROSCOPIC
Bacteria, UA: NONE SEEN
Bilirubin Urine: NEGATIVE
Glucose, UA: NEGATIVE mg/dL
Ketones, ur: NEGATIVE mg/dL
LEUKOCYTES UA: NEGATIVE
Nitrite: NEGATIVE
PH: 6 (ref 5.0–8.0)
Protein, ur: NEGATIVE mg/dL
RBC / HPF: >50 RBC/hpf — ABNORMAL HIGH (ref 0–5)
Specific Gravity, Urine: 1.021 (ref 1.005–1.030)

## 2018-11-02 LAB — PREGNANCY, URINE: Preg Test, Ur: NEGATIVE

## 2018-11-02 MED ORDER — ONDANSETRON 8 MG PO TBDP: 8.0000 mg | ORAL_TABLET | Freq: Three times a day (TID) | ORAL | 0 refills | Status: DC | PRN

## 2018-11-02 MED ORDER — SODIUM CHLORIDE 0.9 % IV BOLUS
1000.0000 mL | Freq: Once | INTRAVENOUS | Status: AC
  Administered 2018-11-02: 1000 mL via INTRAVENOUS

## 2018-11-02 MED ORDER — SODIUM CHLORIDE 0.9% FLUSH
3.0000 mL | Freq: Once | INTRAVENOUS | Status: AC
  Administered 2018-11-02: 3 mL via INTRAVENOUS

## 2018-11-02 NOTE — ED Notes (Signed)
Pt up to toilet 

## 2018-11-02 NOTE — ED Triage Notes (Signed)
Nausea after eating x 6 months. Rarely vomits. Denies abdominal pain at present. Denies fevers.

## 2018-11-02 NOTE — ED Provider Notes (Signed)
[facility] Emergency Department Provider Note ____________________________________________   First MD Initiated Contact with Patient 11/02/18 1123     (approximate)  I have reviewed the triage vital signs and the nursing notes.   HISTORY  Chief Complaint Nausea    HPI Sherry Park is a 33 y.o. female with PMH as noted below who presents with nausea, intermittent, and mainly occurring after she eats.  She states it has been occurring for 6 months and has worsened over the last month to the point where her appetite has decreased and she has not been eating.  The patient has lost some weight and is concerned that she is not getting enough nutrition due to not wanting to eat.  She also reports intermittent loose stools which are sometimes green although there is no blood in the stool.  She has not vomited.  The patient has seen GI and had an endoscopy last month which was negative.  Past Medical History:  Diagnosis Date  . Arrhythmia   . Headache    a. felt to be 2/2 Dostinex  . Hemorrhoids   . Microprolactinoma ([place])    a. on Dostinex; b. followed by Dr. Gabriel Carina, MD  . Ovarian cyst     Patient Active Problem List   Diagnosis Date Noted  . Dyspepsia   . Complex ovarian cyst 07/29/2018  . History of ovarian cyst 07/29/2018  . Pelvic pain in female 07/29/2018  . Arrhythmia   . White coat syndrome with hypertension 05/19/2017  . Mastalgia 07/10/2016  . Generalized anxiety disorder 06/06/2016  . Microprolactinoma ([place]) 04/05/2014  . Oligomenorrhea 04/05/2014    Past Surgical History:  Procedure Laterality Date  . ESOPHAGOGASTRODUODENOSCOPY (EGD) WITH PROPOFOL N/A 09/24/2018   Procedure: ESOPHAGOGASTRODUODENOSCOPY (EGD) WITH PROPOFOL;  Surgeon: Lin Landsman, MD;  Location: [place];  Service: Gastroenterology;  Laterality: N/A;  . NO PAST SURGERIES     verified with patient- 09/09/17    Prior to Admission medications   Medication  Sig Start Date End Date Taking? Authorizing Provider  cabergoline (DOSTINEX) 0.5 MG tablet Take 0.25 mg by mouth 2 (two) times a week.  03/10/18  Yes [provider]  ERRIN 0.35 MG tablet Take 1 tablet by mouth daily. 06/02/18  Yes [provider]  amitriptyline (ELAVIL) 25 MG tablet Take 1 tablet (25 mg total) by mouth at bedtime. 09/24/18 10/24/18  Lin Landsman, MD  ondansetron (ZOFRAN ODT) 8 MG disintegrating tablet Take 1 tablet (8 mg total) by mouth every 8 (eight) hours as needed for nausea or vomiting. 11/02/18   Arta Silence, MD  rifaximin (XIFAXAN) 550 MG TABS tablet Take 1 tablet (550 mg total) by mouth 2 (two) times daily for 14 days. Patient not taking: Reported on 11/02/2018 10/26/18 11/09/18  Lin Landsman, MD    Allergies Doxycycline; Latex; and Pantoprazole  Family History  Problem Relation Age of Onset  . Diabetes Mellitus II Mother   . Hypertension Mother   . Stroke Mother        disabling stroke from oral contraceptive pill use at 31 years of age, no family hx of CVA  . Kidney disease Father   . Hypertension Sister   . Congestive Heart Failure Maternal Grandmother   . Kidney disease Maternal Grandmother   . Cancer Paternal Grandfather        skin  . Hematuria Neg Hx   . Kidney cancer Neg Hx   . Prostate cancer Neg Hx   .  Sickle cell trait Neg Hx   . Tuberculosis Neg Hx     Social History Social History   Tobacco Use  . Smoking status: Never Smoker  . Smokeless tobacco: Never Used  Substance Use Topics  . Alcohol use: No  . Drug use: No    Review of Systems  Constitutional: No fever. Eyes: No redness. ENT: No sore throat. Cardiovascular: Denies chest pain. Respiratory: Denies shortness of breath. Gastrointestinal: Positive for nausea.  Genitourinary: Negative for dysuria.  Musculoskeletal: Negative for back pain. Skin: Negative for rash. Neurological: Negative for  headache.   ____________________________________________   PHYSICAL EXAM:  VITAL SIGNS: ED Triage Vitals  Enc Vitals Group     BP 11/02/18 1055 (!) 141/91     Pulse Rate 11/02/18 1055 (!) 140     Resp 11/02/18 1055 20     Temp 11/02/18 1055 98 F (36.7 C)     Temp Source 11/02/18 1055 Oral     SpO2 11/02/18 1055 98 %     Weight 11/02/18 1056 120 lb (54.4 kg)     Height 11/02/18 1056 5\' 3"  (1.6 m)     Head Circumference --      Peak Flow --      Pain Score 11/02/18 1056 0     Pain Loc --      Pain Edu? --      Excl. in [place]? --     Constitutional: Alert and oriented. Well appearing and in no acute distress. Eyes: Conjunctivae are normal.  No scleral icterus. Head: Atraumatic. Nose: No congestion/rhinnorhea. Mouth/Throat: Mucous membranes are moist.   Neck: Normal range of motion.  Cardiovascular: Normal rate, regular rhythm. Grossly normal heart sounds.  Good peripheral circulation. Respiratory: Normal respiratory effort.  No retractions. Lungs CTAB. Gastrointestinal: Soft and nontender. No distention.  Genitourinary: No flank tenderness. Musculoskeletal: Extremities warm and well perfused.  Neurologic:  Normal speech and language. No gross focal neurologic deficits are appreciated.  Skin:  Skin is warm and dry. No rash noted. Psychiatric: Mood and affect are normal. Speech and behavior are normal.  ____________________________________________   LABS (all labs ordered are listed, but only abnormal results are displayed)  Labs Reviewed  COMPREHENSIVE METABOLIC PANEL - Abnormal; Notable for the following components:      Result Value   Glucose, Bld 108 (*)    Total Protein 8.3 (*)    All other components within normal limits  URINALYSIS, COMPLETE (UACMP) WITH MICROSCOPIC - Abnormal; Notable for the following components:   Color, Urine YELLOW (*)    APPearance CLEAR (*)    Hgb urine dipstick LARGE (*)    RBC / HPF >50 (*)    All other components within normal  limits  LIPASE, BLOOD  CBC  PREGNANCY, URINE   ____________________________________________  EKG  ED ECG REPORT I, Arta Silence, the attending physician, personally viewed and interpreted this ECG.  Date: 11/02/2018 EKG Time: 1218 Rate: 109 Rhythm: Sinus tachycardia QRS Axis: normal Intervals: normal ST/T Wave abnormalities: normal Narrative Interpretation: no evidence of acute ischemia  ____________________________________________  RADIOLOGY  [place] RUQ: No acute abnormalities ____________________________________________   PROCEDURES  Procedure(s) performed: No  Procedures  Critical Care performed: No ____________________________________________   INITIAL IMPRESSION / ASSESSMENT AND PLAN / ED COURSE  Pertinent labs & imaging results that were available during my care of the patient were reviewed by me and considered in my medical decision making (see chart for details).  33 year old female with PMH as noted above  presents with nausea for the last 6 months occurring after she eats, with some loose stools but no associated vomiting or abdominal pain.  She states the symptoms have somewhat worsened recently and she is concerned that she is not getting enough p.o. intake.  However she is able to swallow and hold down food but her appetite has decreased.   I reviewed the past medical records in [place].  The patient had an endoscopy done last month which was essentially negative.  She states that she is planning to follow-up with a gastroenterologist.  She also had CT performed about a year ago which was also negative.  The differential is broad but given the chronic nature of the symptoms and the fact the patient is tolerating p.o., I have low suspicion for any acute cause such as infection.  The endoscopy essentially rules out gastritis or PUD.  Gallbladder disease is possible but less likely given the lack of pain.  The patient has apparently been ruled out for celiac  disease as well.  Overall the patient will mainly need to continue her outpatient GI work-up.  We will obtain a right upper quadrant ultrasound to rule out gallbladder disease, as well as labs to evaluate the patient's electrolytes and creatinine to make sure that she is getting enough p.o.  If the work-up is negative I anticipate discharge home with GI follow-up.  ----------------------------------------- 3:44 PM on 11/02/2018 -----------------------------------------  The lab work-up is unremarkable.  Right upper quadrant ultrasound showed no acute findings.  After the first liter the patient was still somewhat tachycardic when she got up and walked around, however she had no orthostatic symptoms.  She stated that she thought this was mostly due to anxiety.  She declined anxiolytic medication.  I gave a second liter and her heart rate is now around 90 with no significant increase.  I counseled the patient extensively on the results of the work-up and the plan of care going forward.  She agrees to follow-up with her gastroenterologist as scheduled.  I will prescribe some nausea medicine for home.  Return precautions given, and she expresses understanding. ____________________________________________   FINAL CLINICAL IMPRESSION(S) / ED DIAGNOSES  Final diagnoses:  Nausea      NEW MEDICATIONS STARTED DURING THIS VISIT:  New Prescriptions   ONDANSETRON (ZOFRAN ODT) 8 MG DISINTEGRATING TABLET    Take 1 tablet (8 mg total) by mouth every 8 (eight) hours as needed for nausea or vomiting.     Note:  This document was prepared using Dragon voice recognition software and may include unintentional dictation errors.   Arta Silence, MD 11/02/18 [phone]

## 2018-11-02 NOTE — ED Notes (Signed)
Patient transported to Ultrasound 

## 2018-11-02 NOTE — ED Notes (Signed)
Back from US.

## 2018-11-02 NOTE — Discharge Instructions (Addendum)
Take the Zofran as needed for nausea to help you tolerate food better.  Follow-up with your gastroenterologist as scheduled and with your primary care doctor.  In the meantime, return to the emergency department if you have new or worsening symptoms including vomiting, abdominal pain, weakness or lightheadedness, fever, or any other new or worsening symptoms that concern you.

## 2018-11-03 ENCOUNTER — Other Ambulatory Visit: Payer: Self-pay

## 2018-11-03 DIAGNOSIS — R1013 Epigastric pain: Secondary | ICD-10-CM

## 2018-11-03 NOTE — Progress Notes (Signed)
Referral entered for [facility] surgery

## 2018-11-11 ENCOUNTER — Other Ambulatory Visit: Payer: Self-pay

## 2018-11-11 ENCOUNTER — Encounter: Payer: Self-pay | Admitting: Surgery

## 2018-11-11 ENCOUNTER — Ambulatory Visit: Payer: BLUE CROSS/BLUE SHIELD | Admitting: Surgery

## 2018-11-11 VITALS — BP 126/83 | HR 108 | Temp 97.9°F | Ht 63.0 in | Wt 122.6 lb

## 2018-11-11 DIAGNOSIS — R1011 Right upper quadrant pain: Secondary | ICD-10-CM | POA: Diagnosis not present

## 2018-11-11 DIAGNOSIS — G8929 Other chronic pain: Secondary | ICD-10-CM | POA: Diagnosis not present

## 2018-11-11 NOTE — Progress Notes (Signed)
Patient ID: Sherry Park, female   DOB: October 01, 1986, 33 y.o.   MRN: [id number]  HPI Sherry Park is a 33 y.o. female in consultation at the request of Mrs. Lane [facility] for abdominal pain.. Patient endorses chronic abdominal pain for the last 7 months or so it is located in the upper portion of h abdomen.  The pain is intermittent colicky type and moderate in severity.  Apparently carbohydrates and fruit seems to exacerbate symptoms.  She does have some loose stools some rumbling and some abdominal distention.  No fevers no chills.  No weight loss.  She does experience some nausea but no vomiting.  Has been scoped by Dr. Marius Ditch from GI and I have personally reviewed the images showing no significant abnormalities.  She recently went to the emergency room where an ultrasound was performed.  I have personally reviewed the images showing no evidence of gallstones normal gallbladder normal common bile duct. ABC and CMP were completely normal.  I have also personally review a CT scan a year ago when she had hematuria and showing no evidence of any acute intra-abdominal abnormalities.   She does have a family history significant for mother with GERD and grandmother with GB issues.   HPI  Past Medical History:  Diagnosis Date  . Arrhythmia   . Headache    a. felt to be 2/2 Dostinex  . Hemorrhoids   . Microprolactinoma ([place])    a. on Dostinex; b. followed by Dr. Gabriel Carina, MD  . Ovarian cyst     Past Surgical History:  Procedure Laterality Date  . ESOPHAGOGASTRODUODENOSCOPY (EGD) WITH PROPOFOL N/A 09/24/2018   Procedure: ESOPHAGOGASTRODUODENOSCOPY (EGD) WITH PROPOFOL;  Surgeon: Lin Landsman, MD;  Location: [place];  Service: Gastroenterology;  Laterality: N/A;  . NO PAST SURGERIES     verified with patient- 09/09/17    Family History  Problem Relation Age of Onset  . Diabetes Mellitus II Mother   . Hypertension Mother   . Stroke Mother        disabling stroke from oral  contraceptive pill use at 33 years of age, no family hx of CVA  . Kidney disease Father   . Hypertension Sister   . Congestive Heart Failure Maternal Grandmother   . Kidney disease Maternal Grandmother   . Cancer Paternal Grandfather        skin  . Hematuria Neg Hx   . Kidney cancer Neg Hx   . Prostate cancer Neg Hx   . Sickle cell trait Neg Hx   . Tuberculosis Neg Hx     Social History Social History   Tobacco Use  . Smoking status: Never Smoker  . Smokeless tobacco: Never Used  Substance Use Topics  . Alcohol use: No  . Drug use: No    Allergies  Allergen Reactions  . Doxycycline Other (See Comments)    Thrush and rash  . Latex Itching  . Pantoprazole Other (See Comments)    abdominal pain    Current Outpatient Medications  Medication Sig Dispense Refill  . cabergoline (DOSTINEX) 0.5 MG tablet Take 0.25 mg by mouth 2 (two) times a week.     Marland Kitchen ERRIN 0.35 MG tablet Take 1 tablet by mouth daily.  3  . Multiple Vitamins-Minerals (MULTIVITAMIN ADULTS) TABS Take by mouth.    Marland Kitchen amitriptyline (ELAVIL) 25 MG tablet Take 1 tablet (25 mg total) by mouth at bedtime. 30 tablet 2   No current facility-administered medications for this visit.  Review of Systems Full ROS  was asked and was negative except for the information on the HPI  Physical Exam Blood pressure 126/83, pulse (!) 108, temperature 97.9 F (36.6 C), temperature source Temporal, height 5\' 3"  (1.6 m), weight 122 lb 9.6 oz (55.6 kg), last menstrual period 10/26/2018, SpO2 98 %. CONSTITUTIONAL: NAD EYES: Pupils are equal, round, and reactive to light, Sclera are non-icteric. EARS, NOSE, MOUTH AND THROAT: The oropharynx is clear. The oral mucosa is pink and moist. Hearing is intact to voice. LYMPH NODES:  Lymph nodes in the neck are normal. RESPIRATORY:  Lungs are clear. There is normal respiratory effort, with equal breath sounds bilaterally, and without pathologic use of accessory muscles. CARDIOVASCULAR:  Heart is regular without murmurs, gallops, or rubs. GI: The abdomen is soft, nontender, and nondistended. There are no palpable masses. There is no hepatosplenomegaly. There are normal bowel sounds in all quadrants. GU: Rectal deferred.   MUSCULOSKELETAL: Normal muscle strength and tone. No cyanosis or edema.   SKIN: Turgor is good and there are no pathologic skin lesions or ulcers. NEUROLOGIC: Motor and sensation is grossly normal. Cranial nerves are grossly intact. PSYCH:  Oriented to person, place and time. Affect is normal.  Data Reviewed  I have personally reviewed the patient's imaging, laboratory findings and medical records.    Assessment/Plan 22-year-old with chronic abdominal pain and differential will include biliary dyskinesia versus irritable bowel.  Discussed with the patient in detail about her imaging findings.  Discussed with her about potentially obtaining a HIDA scan to elucidate biliary dyskinesia.  She does have a follow-up appointment with Dr. Marius Ditch next week.  At this time she wishes to wait before I order the HIDA scan.  She wants to discuss this further with Dr. Marius Ditch.  Certainly food allergy or intolerance might be part of the differential. At this Time there is no need for emergent surgical intervention.  I will be happy to follow her up in about 3 to 4 weeks and reassess her condition.  A copy of this report was sent ot the referring provider  Caroleen Hamman, MD FACS General Surgeon 11/11/2018, 2:35 PM

## 2018-11-11 NOTE — Patient Instructions (Addendum)
Patient will need to return to the office in 4 weeks, the patient will need to be scheduled for a hida scan she will call after she meets with Dr.Vanga.  Call the office with any questions or concerns.   Gallbladder Nuclear Scan  A gallbladder nuclear scan (hepatobiliary scan or HIDA scan) is an imaging test that checks the function of your liver, gallbladder, and the ducts of the liver and gallbladder. These parts make up the hepatobiliary system. You may need this scan if you have symptoms of liver or gallbladder disease. For this exam, a radioactive substance (radiotracer) will be injected into your bloodstream through an IV. As the radiotracer moves through your hepatobiliary system, a camera will detect the energy that the radiotracer gives off. The camera will produce images that show how well your hepatobiliary system is functioning. Tell a health care provider about:  Any allergies you have.  All medicines you are taking, including vitamins, herbs, eye drops, creams, and over-the-counter medicines.  Any problems you or your family members have had with anesthetic medicines.  Any blood disorders you have.  Any surgeries you have had.  Any medical conditions you have.  Whether you are pregnant, may be pregnant, or are breastfeeding. What are the risks?  Getting a gallbladder nuclear scan is a safe procedure. However, you will be exposed to a small amount of radiation.  There is a risk of an allergic reaction to medicines used during the test. What happens before the procedure? General instructions  Follow instructions from your health care provider about eating or drinking restrictions. You may not be able to eat or drink anything for 4 hours before the test.  Ask your health care provider about changing or stopping your regular medicines. What happens during the procedure?   You will lie down on your back.  An IV will be inserted into a vein in your hand or  arm.  Radiotracer will be injected through your IV. You may feel a cold sensation as this happens.  A camera (gamma camera) will be moved over your abdomen.  Images will be taken. The camera may rotate around your body. You may be asked to stay still or move into a certain position.  You may be given a medicine (cholecystokinin, CCK) through your IV. This medicine will make your gallbladder empty. You may feel some nausea or have cramping for a short time.  More images will be taken.  After all images have been taken, your IV will be removed. The procedure may vary among health care providers and hospitals. What happens after the procedure?  You should drink plenty of fluids to help your body get rid of the radiotracer.  It is up to you to get your test results. Ask your health care provider, or the department that is doing the test, when your results will be ready. Summary  A gallbladder nuclear scan (hepatobiliary scan or HIDA scan) is an imaging test that checks the function of your liver, gallbladder, and the ducts of the liver and gallbladder.  You may need this scan if you have symptoms of liver or gallbladder disease.  For this exam, a radioactive substance (radiotracer) will be injected into your bloodstream through an IV.  The camera will produce images that show how well your hepatobiliary system is functioning. This information is not intended to replace advice given to you by your health care provider. Make sure you discuss any questions you have with your health care provider.  Document Released: 09/20/2000 Document Revised: 10/03/2017 Document Reviewed: 10/03/2017 Elsevier Interactive Patient Education  2019 [employer].

## 2018-11-17 ENCOUNTER — Encounter: Payer: Self-pay | Admitting: Gastroenterology

## 2018-11-17 ENCOUNTER — Ambulatory Visit: Payer: BLUE CROSS/BLUE SHIELD | Admitting: Gastroenterology

## 2018-11-17 VITALS — BP 130/81 | HR 102 | Resp 18 | Ht 63.0 in | Wt 123.8 lb

## 2018-11-17 DIAGNOSIS — K30 Functional dyspepsia: Secondary | ICD-10-CM | POA: Diagnosis not present

## 2018-11-17 DIAGNOSIS — R1013 Epigastric pain: Secondary | ICD-10-CM

## 2018-11-17 NOTE — Progress Notes (Signed)
Cephas Darby, MD [address]  [place]  [place], [place] [address]  Main: [phone]  Fax: [phone]    Gastroenterology Consultation  Referring Provider:     Volney American,* Primary Care Physician:  Volney American, PA-C Primary Gastroenterologist:  Dr. Cephas Darby Reason for Consultation:     Dyspepsia        HPI:   Sherry Park is a 33 y.o. female referred by Dr. Orene Desanctis, Lilia Argue, PA-C  for consultation & management of chronic dyspepsia.  Patient has been experiencing nausea, early satiety, bloating, burping since July, got worse in August.  Due to persistent symptoms, she was empirically treated for H. pylori infection with triple therapy based on positive serologies.  Her symptoms improved but continues to have early satiety associated with some bloating and burping. She just started taking probiotics which seems to help with bloating.  She denies weight loss, diarrhea.  She denies any other GI symptoms.  Currently, not on PPI  Follow-up visit 09/15/2018 Her symptoms are persistent and would like to get an EGD.  She noticed that eating carbs particularly are making her symptoms worse.  She continues to lose weight.  She lost about 16 pounds since 05/2018.  She tried FD guard which provided some symptomatic relief.  She continues to report fatigue.  Her H. pylori breath test was negative, B12 normal,  serum TTG IgA negative.  Unknown total IgA levels.  Vitamin D levels normal.  She does not report any anxiety, stress or depression  Follow-up visit 11/17/2018 Patient underwent EGD with duodenal and gastric biopsies which were negative.  Food allergy testing also came back unremarkable.  She saw Dr. Perrin Maltese to evaluate for gallbladder etiology, he felt her symptoms could be related to gallbladder dyskinesia or functional GI disorder and recommended HIDA scan.  However, patient decided to wait for the HIDA scan.  Today, she reports feeling better,  appears to be in good spirits, felt happy as she gained about 4 pounds since last visit.  She brought a list of foods that she noticed triggering her symptoms which were sugary substances, eggs, milk, apple sauce, grapes, watermelon, sweetened tea.  Prior to all the symptoms, she was drinking a lot of sodas.  She would like to know if she has any fructose or sucrose intolerance.   NSAIDs: None  Antiplts/Anticoagulants/Anti thrombotics: None  GI Procedures:  EGD 09/24/2018 - Normal duodenal bulb and second portion of the duodenum. Biopsied. - A few gastric polyps. - Normal stomach. Biopsied. - Normal gastroesophageal junction and esophagus. DIAGNOSIS:  A. DUODENUM; COLD BIOPSY:  - ENTERIC MUCOSA WITH PRESERVED VILLOUS ARCHITECTURE AND NO SIGNIFICANT  HISTOPATHOLOGIC CHANGE.  - NEGATIVE FOR FEATURES OF CELIAC, DYSPLASIA, AND MALIGNANCY.   B. STOMACH, RANDOM; COLD BIOPSY:  - OXYNTIC MUCOSA WITH MINIMAL CHRONIC GASTRITIS AND OXYNTIC GLAND  HYPERPLASIA, SEE NOTE.  - NEGATIVE FOR H. PYLORI, DYSPLASIA AND MALIGNANCY.  She denies family history of GI malignancy She does not smoke or drink alcohol Denies any GI surgeries  Past Medical History:  Diagnosis Date  . Arrhythmia   . Headache    a. felt to be 2/2 Dostinex  . Hemorrhoids   . Microprolactinoma ([place])    a. on Dostinex; b. followed by Dr. Gabriel Carina, MD  . Ovarian cyst     Past Surgical History:  Procedure Laterality Date  . ESOPHAGOGASTRODUODENOSCOPY (EGD) WITH PROPOFOL N/A 09/24/2018   Procedure: ESOPHAGOGASTRODUODENOSCOPY (EGD) WITH PROPOFOL;  Surgeon: Sherri Sear  Reece Levy, MD;  Location: [facility];  Service: Gastroenterology;  Laterality: N/A;  . NO PAST SURGERIES     verified with patient- 09/09/17    Current Outpatient Medications:  .  cabergoline (DOSTINEX) 0.5 MG tablet, Take 0.25 mg by mouth 2 (two) times a week. , Disp: , Rfl:  .  ERRIN 0.35 MG tablet, Take 1 tablet by mouth daily., Disp: , Rfl: 3 .  Multiple  Vitamins-Minerals (MULTIVITAMIN ADULTS) TABS, Take by mouth., Disp: , Rfl:  .  amitriptyline (ELAVIL) 25 MG tablet, Take 1 tablet (25 mg total) by mouth at bedtime., Disp: 30 tablet, Rfl: 2   Family History  Problem Relation Age of Onset  . Diabetes Mellitus II Mother   . Hypertension Mother   . Stroke Mother        disabling stroke from oral contraceptive pill use at 32 years of age, no family hx of CVA  . Kidney disease Father   . Hypertension Sister   . Congestive Heart Failure Maternal Grandmother   . Kidney disease Maternal Grandmother   . Cancer Paternal Grandfather        skin  . Hematuria Neg Hx   . Kidney cancer Neg Hx   . Prostate cancer Neg Hx   . Sickle cell trait Neg Hx   . Tuberculosis Neg Hx      Social History   Tobacco Use  . Smoking status: Never Smoker  . Smokeless tobacco: Never Used  Substance Use Topics  . Alcohol use: No  . Drug use: No    Allergies as of 11/17/2018 - Review Complete 11/17/2018  Allergen Reaction Noted  . Doxycycline Other (See Comments) 06/03/2017  . Latex Itching 07/01/2016  . Pantoprazole Other (See Comments) 07/10/2018    Review of Systems:    All systems reviewed and negative except where noted in HPI.   Physical Exam:  BP 130/81 (BP Location: Left Arm, Patient Position: Sitting, Cuff Size: Normal)   Pulse (!) 102   Resp 18   Ht 5\' 3"  (1.6 m)   Wt 123 lb 12.8 oz (56.2 kg)   LMP 10/26/2018   BMI 21.93 kg/m  Patient's last menstrual period was 10/26/2018.  General:   Alert,  Well-developed, well-nourished, pleasant and cooperative in NAD Head:  Normocephalic and atraumatic. Eyes:  Sclera clear, no icterus.   Conjunctiva pink. Ears:  Normal auditory acuity. Nose:  No deformity, discharge, or lesions. Mouth:  No deformity or lesions,oropharynx pink & moist. Neck:  Supple; no masses or thyromegaly. Lungs:  Respirations even and unlabored.  Clear throughout to auscultation.   No wheezes, crackles, or rhonchi. No acute  distress. Heart:  Regular rate and rhythm; no murmurs, clicks, rubs, or gallops. Abdomen:  Normal bowel sounds. Soft, non-tender and non-distended without masses, hepatosplenomegaly or hernias noted.  No guarding or rebound tenderness.   Rectal: Not performed Msk:  Symmetrical without gross deformities. Good, equal movement & strength bilaterally. Pulses:  Normal pulses noted. Extremities:  No clubbing or edema.  No cyanosis. Neurologic:  Alert and oriented x3;  grossly normal neurologically. Skin:  Intact without significant lesions or rashes. No jaundice. Psych:  Alert and cooperative. Normal mood and affect.  Imaging Studies: None  Assessment and Plan:   JORDI KAMM is a 33 y.o. Caucasian female with symptoms consistent with dyspepsia for the last 6 months, empirically treated with triple therapy for positive H. pylori serologies, modestly improved associated with significant weight loss. EGD with gastric and duodenal biopsies  unremarkable, alpha gal panel, food allergy profile unremarkable, ferritin, iron and TIBC, total IgA and folate levels normal.  Patient was evaluated by general surgery who recommended HIDA scan to evaluate for biliary dyskinesia.  Patient deferred this test at this time as she figured out food triggers causing her symptoms.  She probably has functional dyspepsia.  She gained about 4 pounds which is reassuring.  Her symptoms seem to be much better after eliminating the food triggers  -She would like to try 2 weeks course of rifaximin for possible bacterial overgrowth which I prescribed earlier -Discussed with her about low FODMAPs diet, education material provided   Follow up in 3 months or contact via my chart as needed   Cephas Darby, MD

## 2018-12-02 ENCOUNTER — Encounter: Payer: Self-pay | Admitting: Surgery

## 2018-12-02 ENCOUNTER — Other Ambulatory Visit: Payer: Self-pay

## 2018-12-02 ENCOUNTER — Ambulatory Visit: Payer: BLUE CROSS/BLUE SHIELD | Admitting: Surgery

## 2018-12-02 VITALS — BP 114/75 | HR 96 | Temp 97.7°F | Resp 14 | Ht 63.0 in | Wt 121.8 lb

## 2018-12-02 DIAGNOSIS — G8929 Other chronic pain: Secondary | ICD-10-CM | POA: Diagnosis not present

## 2018-12-02 DIAGNOSIS — R1011 Right upper quadrant pain: Secondary | ICD-10-CM | POA: Diagnosis not present

## 2018-12-02 NOTE — Patient Instructions (Signed)
The patient is aware to call back for any questions or new concerns. Follow up as needed Call if you decide to have HIDA scan

## 2018-12-02 NOTE — Progress Notes (Signed)
Outpatient Surgical Follow Up  12/02/2018  Sherry Park is an 33 y.o. female.   Chief Complaint  Patient presents with  . Follow-up    abdominal pain    HPI: Sherry Park is following Up For Abdominal Pain.  She Has a food diary and has avoided certain meals.  Since then her symptoms have improved.  She has been seen by GI and they are working on her medical management of her abdominal pain.  She states that she can actually eat bacon and that does not trigger any symptoms.  She wants to hold off on her HIDA scan. She denies any fevers any chills.  No evidence of biliary obstruction.  Past Medical History:  Diagnosis Date  . Arrhythmia   . Headache    a. felt to be 2/2 Dostinex  . Hemorrhoids   . Microprolactinoma ([place])    a. on Dostinex; b. followed by Dr. Gabriel Carina, MD  . Ovarian cyst     Past Surgical History:  Procedure Laterality Date  . ESOPHAGOGASTRODUODENOSCOPY (EGD) WITH PROPOFOL N/A 09/24/2018   Procedure: ESOPHAGOGASTRODUODENOSCOPY (EGD) WITH PROPOFOL;  Surgeon: Lin Landsman, MD;  Location: [facility];  Service: Gastroenterology;  Laterality: N/A;  . NO PAST SURGERIES     verified with patient- 09/09/17    Family History  Problem Relation Age of Onset  . Diabetes Mellitus II Mother   . Hypertension Mother   . Stroke Mother        disabling stroke from oral contraceptive pill use at 33 years of age, no family hx of CVA  . Kidney disease Father   . Hypertension Sister   . Congestive Heart Failure Maternal Grandmother   . Kidney disease Maternal Grandmother   . Cancer Paternal Grandfather        skin  . Hematuria Neg Hx   . Kidney cancer Neg Hx   . Prostate cancer Neg Hx   . Sickle cell trait Neg Hx   . Tuberculosis Neg Hx     Social History:  reports that she has never smoked. She has never used smokeless tobacco. She reports that she does not drink alcohol or use drugs.  Allergies:  Allergies  Allergen Reactions  . Doxycycline Other (See  Comments)    Thrush and rash  . Latex Itching  . Pantoprazole Other (See Comments)    abdominal pain    Medications reviewed.    ROS Full ROS performed and is otherwise negative other than what is stated in HPI   BP 114/75   Pulse 96   Temp 97.7 F (36.5 C) (Temporal)   Resp 14   Ht 5\' 3"  (1.6 m)   Wt 121 lb 12.8 oz (55.2 kg)   LMP 11/25/2018   SpO2 98%   BMI 21.58 kg/m   Physical Exam Vitals signs and nursing note reviewed.  Constitutional:      Appearance: Normal appearance.  Cardiovascular:     Rate and Rhythm: Normal rate and regular rhythm.     Heart sounds: No murmur.  Pulmonary:     Effort: Pulmonary effort is normal. No respiratory distress.     Breath sounds: No stridor.  Abdominal:     General: Abdomen is flat. There is no distension.     Palpations: Abdomen is soft. There is no mass.     Tenderness: There is no abdominal tenderness. There is no right CVA tenderness or guarding.     Hernia: No hernia is present.  Skin:  General: Skin is warm and dry.     Capillary Refill: Capillary refill takes less than 2 seconds.  Neurological:     General: No focal deficit present.     Mental Status: She is alert and oriented to person, place, and time.  Psychiatric:        Mood and Affect: Mood normal.        Behavior: Behavior normal.        Thought Content: Thought content normal.        Judgment: Judgment normal.      Assessment/Plan: 33 year old female with functional abdominal pain.  We will hold off any further work-up since symptoms have improved.  We will be happy to obtain a HIDA scan if the patient's symptoms recur.  No need for any surgical intervention at this time  Caroleen Hamman, MD [facility]

## 2018-12-07 ENCOUNTER — Encounter: Payer: BLUE CROSS/BLUE SHIELD | Admitting: Family Medicine

## 2018-12-08 ENCOUNTER — Encounter: Payer: Self-pay | Admitting: Gastroenterology

## 2019-02-16 ENCOUNTER — Encounter: Payer: BLUE CROSS/BLUE SHIELD | Admitting: Family Medicine

## 2019-02-16 ENCOUNTER — Ambulatory Visit: Payer: BLUE CROSS/BLUE SHIELD | Admitting: Gastroenterology

## 2019-02-17 ENCOUNTER — Encounter: Payer: Self-pay | Admitting: Gastroenterology

## 2019-04-09 ENCOUNTER — Telehealth: Payer: Self-pay | Admitting: Family Medicine

## 2019-04-09 NOTE — Telephone Encounter (Signed)
Patient is calling to get an Appointment for Monday. Patient is having an eye problem. With green sticky stuff in the corners. Please advise thank you

## 2019-04-12 ENCOUNTER — Other Ambulatory Visit: Payer: Self-pay

## 2019-04-12 ENCOUNTER — Encounter: Payer: Self-pay | Admitting: Family Medicine

## 2019-04-12 ENCOUNTER — Ambulatory Visit (INDEPENDENT_AMBULATORY_CARE_PROVIDER_SITE_OTHER): Payer: BLUE CROSS/BLUE SHIELD | Admitting: Family Medicine

## 2019-04-12 VITALS — Ht 63.0 in | Wt 117.0 lb

## 2019-04-12 DIAGNOSIS — H5789 Other specified disorders of eye and adnexa: Secondary | ICD-10-CM

## 2019-04-12 MED ORDER — POLYMYXIN B-TRIMETHOPRIM 10000-0.1 UNIT/ML-% OP SOLN: 1.0000 [drp] | OPHTHALMIC | 0 refills | Status: DC

## 2019-04-12 NOTE — Progress Notes (Signed)
Ht 5\' 3"  (1.6 m)   Wt 117 lb (53.1 kg)   BMI 20.73 kg/m    Subjective:    Patient ID: Sherry Park, female    DOB: 07/27/1986, 33 y.o.   MRN: [id number]  HPI: Sherry Park is a 33 y.o. female  Chief Complaint  Patient presents with  . Eye Itching    Ongonig couple months. Bilateral. Itching has increased over the last week.   . Eye Drainage    Left eye has green drainiage.     . This visit was completed via WebEx due to the restrictions of the COVID-19 pandemic. All issues as above were discussed and addressed. Physical exam was done as above through visual confirmation on WebEx. If it was felt that the patient should be evaluated in the office, they were directed there. The patient verbally consented to this visit. . Location of the patient: home . Location of the provider: home . Those involved with this call:  . Provider: Merrie Roof, PA-C . CMA: Merilyn Baba, [place] . Front Desk/Registration: Jill Side  . Time spent on call: 15 minutes with patient face to face via video conference. More than 50% of this time was spent in counseling and coordination of care. 5 minutes total spent in review of patient's record and preparation of their chart. I verified patient identity using two factors (patient name and date of birth). Patient consents verbally to being seen via telemedicine visit today.   Presenting today for several months of b/l eye itching that has worsened over the past week. Now having some green drainage in the left eye. No significant redness noted, blurry vision, eye pain or pressure, headaches, N/V, swelling of surrounding skin, no injury or known irritant exposures or new makeups/products. Feels like she's going to get a stye but never does. Has not been trying anything OTC other than visine drops which have not helped.   Relevant past medical, surgical, family and social history reviewed and updated as indicated. Interim medical history since our last visit  reviewed. Allergies and medications reviewed and updated.  Review of Systems  Per HPI unless specifically indicated above     Objective:    Ht 5\' 3"  (1.6 m)   Wt 117 lb (53.1 kg)   BMI 20.73 kg/m   Wt Readings from Last 3 Encounters:  04/12/19 117 lb (53.1 kg)  12/02/18 121 lb 12.8 oz (55.2 kg)  11/17/18 123 lb 12.8 oz (56.2 kg)    Physical Exam Vitals signs and nursing note reviewed.  Constitutional:      General: She is not in acute distress.    Appearance: Normal appearance.  HENT:     Head: Atraumatic.     Right Ear: External ear normal.     Left Ear: External ear normal.     Nose: Nose normal. No congestion.     Mouth/Throat:     Mouth: Mucous membranes are moist.     Pharynx: Oropharynx is clear. No posterior oropharyngeal erythema.  Eyes:     Comments: Mild clear discharge b/l No noticeable conjunctival discoloration b/l No periorbital discoloration or edema EOMs intact  Neck:     Musculoskeletal: Normal range of motion.  Cardiovascular:     Comments: Unable to assess via virtual visit Pulmonary:     Effort: Pulmonary effort is normal. No respiratory distress.  Musculoskeletal: Normal range of motion.  Skin:    General: Skin is dry.     Findings: No erythema.  Neurological:     Mental Status: She is alert and oriented to person, place, and time.  Psychiatric:        Mood and Affect: Mood normal.        Thought Content: Thought content normal.        Judgment: Judgment normal.     Results for orders placed or performed during the hospital encounter of 11/02/18  Lipase, blood  Result Value Ref Range   Lipase 38 11 - 51 U/L  Comprehensive metabolic panel  Result Value Ref Range   Sodium 140 135 - 145 mmol/L   Potassium 4.1 3.5 - 5.1 mmol/L   Chloride 104 98 - 111 mmol/L   CO2 27 22 - 32 mmol/L   Glucose, Bld 108 (H) 70 - 99 mg/dL   BUN 12 6 - 20 mg/dL   Creatinine, Ser 0.70 0.44 - 1.00 mg/dL   Calcium 10.0 8.9 - 10.3 mg/dL   Total Protein 8.3  (H) 6.5 - 8.1 g/dL   Albumin 4.7 3.5 - 5.0 g/dL   AST 16 15 - 41 U/L   ALT 16 0 - 44 U/L   Alkaline Phosphatase 41 38 - 126 U/L   Total Bilirubin 1.2 0.3 - 1.2 mg/dL   GFR calc non Af Amer >60 >60 mL/min   GFR calc Af Amer >60 >60 mL/min   Anion gap 9 5 - 15  CBC  Result Value Ref Range   WBC 5.8 4.0 - 10.5 K/uL   RBC 4.78 3.87 - 5.11 MIL/uL   Hemoglobin 14.4 12.0 - 15.0 g/dL   HCT 43.8 36.0 - 46.0 %   MCV 91.6 80.0 - 100.0 fL   MCH 30.1 26.0 - 34.0 pg   MCHC 32.9 30.0 - 36.0 g/dL   RDW 12.8 11.5 - 15.5 %   Platelets 154 150 - 400 K/uL   nRBC 0.0 0.0 - 0.2 %  Urinalysis, Complete w Microscopic  Result Value Ref Range   Color, Urine YELLOW (A) YELLOW   APPearance CLEAR (A) CLEAR   Specific Gravity, Urine 1.021 1.005 - 1.030   pH 6.0 5.0 - 8.0   Glucose, UA NEGATIVE NEGATIVE mg/dL   Hgb urine dipstick LARGE (A) NEGATIVE   Bilirubin Urine NEGATIVE NEGATIVE   Ketones, ur NEGATIVE NEGATIVE mg/dL   Protein, ur NEGATIVE NEGATIVE mg/dL   Nitrite NEGATIVE NEGATIVE   Leukocytes, UA NEGATIVE NEGATIVE   RBC / HPF >50 (H) 0 - 5 RBC/hpf   WBC, UA 0-5 0 - 5 WBC/hpf   Bacteria, UA NONE SEEN NONE SEEN   Squamous Epithelial / LPF 6-10 0 - 5   Mucus PRESENT   Pregnancy, urine  Result Value Ref Range   Preg Test, Ur NEGATIVE NEGATIVE      Assessment & Plan:   Problem List Items Addressed This Visit    None    Visit Diagnoses    Eye drainage    -  Primary   Avoid eye makeup, toss old mascara and eyeliner if used recently. Polytrim drops, compresses, f/u if not improving       Follow up plan: Return if symptoms worsen or fail to improve.

## 2019-04-12 NOTE — Telephone Encounter (Signed)
Called pt to schedule appt, no answer, left vm 

## 2019-04-16 ENCOUNTER — Ambulatory Visit: Payer: BLUE CROSS/BLUE SHIELD | Admitting: Gastroenterology

## 2019-04-16 ENCOUNTER — Encounter: Payer: Self-pay | Admitting: Family Medicine

## 2019-04-19 ENCOUNTER — Other Ambulatory Visit: Payer: Self-pay | Admitting: Family Medicine

## 2019-04-19 MED ORDER — EUCRISA 2 % EX OINT: 1.0000 "application " | TOPICAL_OINTMENT | Freq: Two times a day (BID) | CUTANEOUS | 1 refills | Status: DC | PRN

## 2019-04-20 ENCOUNTER — Telehealth: Payer: Self-pay

## 2019-04-20 NOTE — Telephone Encounter (Signed)
PA for Eucrisa initiated and submitted via Cover My Meds. Key: [id number]

## 2019-04-23 NOTE — Telephone Encounter (Signed)
PA denied.

## 2019-04-26 NOTE — Telephone Encounter (Signed)
Alternative medication preferred is generic tacrolimus. Will also require P.A.

## 2019-04-27 NOTE — Telephone Encounter (Signed)
Left message on machine for pt to return call to the office.  

## 2019-04-27 NOTE — Telephone Encounter (Signed)
Please ask the patient if she would like for me to send over this other option due to coverage or what she would prefer at this time. There may also be a eucrisa coupon on the drug site she could try

## 2019-04-28 NOTE — Telephone Encounter (Signed)
Called and spoke to patient. She states that her eye is actually clearing up so she is going to hold off on any medications at this time. Routing to [place] for an [employer].

## 2019-04-30 ENCOUNTER — Encounter: Payer: Self-pay | Admitting: Gastroenterology

## 2019-04-30 ENCOUNTER — Ambulatory Visit (INDEPENDENT_AMBULATORY_CARE_PROVIDER_SITE_OTHER): Payer: BLUE CROSS/BLUE SHIELD | Admitting: Gastroenterology

## 2019-04-30 ENCOUNTER — Other Ambulatory Visit: Payer: Self-pay

## 2019-04-30 DIAGNOSIS — K30 Functional dyspepsia: Secondary | ICD-10-CM | POA: Diagnosis not present

## 2019-04-30 NOTE — Progress Notes (Signed)
Sherry Sear, MD [address]  [place]  [place], [place] [address]  Main: [phone]  Fax: [phone]    Gastroenterology Consultation Video Visit  Referring Provider:     Volney American,* Primary Care Physician:  Volney American, PA-C Primary Gastroenterologist:  Dr. Cephas Darby Reason for Consultation:     Functional dyspepsia        HPI:   Sherry Park is a 33 y.o. female referred by Dr. Orene Desanctis, Lilia Argue, PA-C  for consultation & management of functional dyspepsia  Virtual Visit Video Note  I connected with Sherry Park on 04/30/19 at  9:15 AM EDT by video and verified that I am speaking with the correct person using two identifiers.   I discussed the limitations, risks, security and privacy concerns of performing an evaluation and management service by video and the availability of in person appointments. I also discussed with the patient that there may be a patient responsible charge related to this service. The patient expressed understanding and agreed to proceed.  Location of the Patient: Home  Location of the provider: Work  Persons participating in the visit: Patient and provider only   History of Present Illness: Sherry Park has been seeing me for management of functional dyspepsia.  She reports her symptoms are more or less the same.  She was seen by Dr. Owens Shark to evaluate for any gallbladder etiology.  She deferred HIDA scan at this time.  She has tried low FODMAP diet, and found that some of the foods that are listed in the low FODMAPs for example potatoes and wheat which she is not tolerating.  Her primary symptom is significant abdominal bloating.  She wanted to get fructose and sucrose breath test and confirmed appointment at [facility] on 05/14/2019.  We have tried align, Culturelle.  Align provided mild relief.  Culturelle was not bloating.  She did not find any benefit from rifaximin.  She tried FD guard which did not seem to  help.  Currently on low FODMAPs diet.  Her weight is stable    NSAIDs: None  Antiplts/Anticoagulants/Anti thrombotics: None  GI Procedures:  EGD 09/24/2018 - Normal duodenal bulb and second portion of the duodenum. Biopsied. - A few gastric polyps. - Normal stomach. Biopsied. - Normal gastroesophageal junction and esophagus. DIAGNOSIS:  A. DUODENUM; COLD BIOPSY:  - ENTERIC MUCOSA WITH PRESERVED VILLOUS ARCHITECTURE AND NO SIGNIFICANT  HISTOPATHOLOGIC CHANGE.  - NEGATIVE FOR FEATURES OF CELIAC, DYSPLASIA, AND MALIGNANCY.   B. STOMACH, RANDOM; COLD BIOPSY:  - OXYNTIC MUCOSA WITH MINIMAL CHRONIC GASTRITIS AND OXYNTIC GLAND  HYPERPLASIA, SEE NOTE.  - NEGATIVE FOR H. PYLORI, DYSPLASIA AND MALIGNANCY.  She denies family history of GI malignancy She does not smoke or drink alcohol Denies any GI surgeries  Past Medical History:  Diagnosis Date  . Arrhythmia   . Headache    a. felt to be 2/2 Dostinex  . Hemorrhoids   . Microprolactinoma ([place])    a. on Dostinex; b. followed by Dr. Gabriel Carina, MD  . Ovarian cyst     Past Surgical History:  Procedure Laterality Date  . ESOPHAGOGASTRODUODENOSCOPY (EGD) WITH PROPOFOL N/A 09/24/2018   Procedure: ESOPHAGOGASTRODUODENOSCOPY (EGD) WITH PROPOFOL;  Surgeon: Lin Landsman, MD;  Location: [facility];  Service: Gastroenterology;  Laterality: N/A;  . NO PAST SURGERIES     verified with patient- 09/09/17    Current Outpatient Medications:  .  cabergoline (DOSTINEX) 0.5 MG tablet, Take 0.25 mg by mouth  2 (two) times a week. , Disp: , Rfl:  .  Crisaborole (EUCRISA) 2 % OINT, Apply 1 application topically 2 (two) times daily as needed., Disp: 100 g, Rfl: 1 .  ERRIN 0.35 MG tablet, Take 1 tablet by mouth daily., Disp: , Rfl: 3 .  Multiple Vitamins-Minerals (MULTIVITAMIN ADULTS) TABS, Take by mouth., Disp: , Rfl:  .  SUMAtriptan (IMITREX) 100 MG tablet, , Disp: , Rfl:  .  trimethoprim-polymyxin b (POLYTRIM) ophthalmic solution, Place 1  drop into both eyes every 4 (four) hours., Disp: 10 mL, Rfl: 0    Family History  Problem Relation Age of Onset  . Diabetes Mellitus II Mother   . Hypertension Mother   . Stroke Mother        disabling stroke from oral contraceptive pill use at 31 years of age, no family hx of CVA  . Kidney disease Father   . Hypertension Sister   . Congestive Heart Failure Maternal Grandmother   . Kidney disease Maternal Grandmother   . Cancer Paternal Grandfather        skin  . Hematuria Neg Hx   . Kidney cancer Neg Hx   . Prostate cancer Neg Hx   . Sickle cell trait Neg Hx   . Tuberculosis Neg Hx      Social History   Tobacco Use  . Smoking status: Never Smoker  . Smokeless tobacco: Never Used  Substance Use Topics  . Alcohol use: No  . Drug use: No    Allergies as of 04/30/2019 - Review Complete 04/30/2019  Allergen Reaction Noted  . Doxycycline Other (See Comments) 06/03/2017  . Latex Itching 07/01/2016  . Pantoprazole Other (See Comments) 07/10/2018     Imaging Studies: Reviewed  Assessment and Plan:   Sherry Park is a 33 y.o. female with symptoms consistent with dyspepsia for the last 6 months, empirically treated with triple therapy for positive H. pylori serologies, modestly improved associated with significant weight loss. EGD with gastric and duodenal biopsies unremarkable, alpha gal panel, food allergy profile unremarkable, ferritin, iron and TIBC, total IgA and folate levels normal.  Patient was evaluated by general surgery who recommended HIDA scan to evaluate for biliary dyskinesia.  Patient deferred this test at this time as she figured out food triggers causing her symptoms.  She probably has functional dyspepsia.  Low FODMAPs diet is partially helpful  Awaiting sucrose and fructose breath test Discussed with her about trial of VSL#3 Suggested her to eliminate gluten, she may have non-celiac gluten sensitivity Offered her to see [facility] for second opinion, she  would like to wait on the results of sucrose and fructose breath test   Follow Up Instructions:   I discussed the assessment and treatment plan with the patient. The patient was provided an opportunity to ask questions and all were answered. The patient agreed with the plan and demonstrated an understanding of the instructions.   The patient was advised to call back or seek an in-person evaluation if the symptoms worsen or if the condition fails to improve as anticipated.  I provided 15 minutes of face-to-face time during this encounter.   Follow up in 3 months   Cephas Darby, MD

## 2019-05-06 ENCOUNTER — Encounter: Payer: Self-pay | Admitting: Gastroenterology

## 2019-05-21 ENCOUNTER — Other Ambulatory Visit: Payer: Self-pay

## 2019-05-21 ENCOUNTER — Encounter: Payer: Self-pay | Admitting: Family Medicine

## 2019-05-21 ENCOUNTER — Ambulatory Visit (INDEPENDENT_AMBULATORY_CARE_PROVIDER_SITE_OTHER): Payer: BLUE CROSS/BLUE SHIELD | Admitting: Family Medicine

## 2019-05-21 VITALS — BP 132/89 | HR 103 | Temp 98.8°F | Ht 63.0 in | Wt 116.2 lb

## 2019-05-21 DIAGNOSIS — Z23 Encounter for immunization: Secondary | ICD-10-CM

## 2019-05-21 DIAGNOSIS — Z0000 Encounter for general adult medical examination without abnormal findings: Secondary | ICD-10-CM | POA: Diagnosis not present

## 2019-05-21 NOTE — Progress Notes (Signed)
BP 132/89   Pulse (!) 103   Temp 98.8 F (37.1 C) (Oral)   Ht 5\' 3"  (1.6 m)   Wt 116 lb 3.2 oz (52.7 kg)   LMP 04/28/2019 (Exact Date)   SpO2 100%   BMI 20.58 kg/m    Subjective:    Patient ID: Sherry Park, female    DOB: 07/04/1986, 33 y.o.   MRN: [id number]  HPI: Sherry Park is a 33 y.o. female presenting on 05/21/2019 for comprehensive medical examination. Current medical complaints include:none  She currently lives with: Menopausal Symptoms: no  Depression Screen done today and results listed below:  Depression screen [facility] 2/9 05/21/2019 08/07/2018 06/10/2018 05/19/2017  Decreased Interest 0 1 1 0  Down, Depressed, Hopeless 0 0 1 0  PHQ - 2 Score 0 1 2 0  Altered sleeping 0 1 2 1   Tired, decreased energy 0 1 2 1   Change in appetite 0 0 3 1  Feeling bad or failure about yourself  0 0 0 0  Trouble concentrating 0 0 1 0  Moving slowly or fidgety/restless 0 0 0 0  Suicidal thoughts 0 0 0 0  PHQ-9 Score 0 3 10 3   Difficult doing work/chores Not difficult at all - - -    The patient does not have a history of falls. I did not complete a risk assessment for falls. A plan of care for falls was not documented.   Past Medical History:  Past Medical History:  Diagnosis Date  . Arrhythmia   . Headache    a. felt to be 2/2 Dostinex  . Hemorrhoids   . Microprolactinoma ([place])    a. on Dostinex; b. followed by Dr. Gabriel Carina, MD  . Ovarian cyst     Surgical History:  Past Surgical History:  Procedure Laterality Date  . ESOPHAGOGASTRODUODENOSCOPY (EGD) WITH PROPOFOL N/A 09/24/2018   Procedure: ESOPHAGOGASTRODUODENOSCOPY (EGD) WITH PROPOFOL;  Surgeon: Lin Landsman, MD;  Location: [facility];  Service: Gastroenterology;  Laterality: N/A;  . NO PAST SURGERIES     verified with patient- 09/09/17    Medications:  Current Outpatient Medications on File Prior to Visit  Medication Sig  . amitriptyline (ELAVIL) 25 MG tablet Take 25 mg by mouth at bedtime.  .  cabergoline (DOSTINEX) 0.5 MG tablet Take 0.25 mg by mouth 2 (two) times a week.   Marland Kitchen ERRIN 0.35 MG tablet Take 1 tablet by mouth daily.  . Multiple Vitamins-Minerals (MULTIVITAMIN ADULTS) TABS Take by mouth.  Stasia Cavalier (EUCRISA) 2 % OINT Apply 1 application topically 2 (two) times daily as needed. (Patient not taking: Reported on 05/21/2019)   No current facility-administered medications on file prior to visit.     Allergies:  Allergies  Allergen Reactions  . Doxycycline Other (See Comments)    Thrush and rash  . Latex Itching  . Pantoprazole Other (See Comments)    abdominal pain    Social History:  Social History   Socioeconomic History  . Marital status: Single    Spouse name: Not on file  . Number of children: Not on file  . Years of education: Not on file  . Highest education level: Not on file  Occupational History  . Not on file  Social Needs  . Financial resource strain: Not on file  . Food insecurity    Worry: Not on file    Inability: Not on file  . Transportation needs    Medical: Not on file  Non-medical: Not on file  Tobacco Use  . Smoking status: Never Smoker  . Smokeless tobacco: Never Used  Substance and Sexual Activity  . Alcohol use: No  . Drug use: No  . Sexual activity: Yes  Lifestyle  . Physical activity    Days per week: Not on file    Minutes per session: Not on file  . Stress: Not on file  Relationships  . Social Herbalist on phone: Not on file    Gets together: Not on file    Attends religious service: Not on file    Active member of club or organization: Not on file    Attends meetings of clubs or organizations: Not on file    Relationship status: Not on file  . Intimate partner violence    Fear of current or ex partner: Not on file    Emotionally abused: Not on file    Physically abused: Not on file    Forced sexual activity: Not on file  Other Topics Concern  . Not on file  Social History Narrative  . Not on  file   Social History   Tobacco Use  Smoking Status Never Smoker  Smokeless Tobacco Never Used   Social History   Substance and Sexual Activity  Alcohol Use No    Family History:  Family History  Problem Relation Age of Onset  . Diabetes Mellitus II Mother   . Hypertension Mother   . Stroke Mother        disabling stroke from oral contraceptive pill use at 33 years of age, no family hx of CVA  . Kidney disease Father   . Hypertension Sister   . Congestive Heart Failure Maternal Grandmother   . Kidney disease Maternal Grandmother   . Cancer Paternal Grandfather        skin  . Hematuria Neg Hx   . Kidney cancer Neg Hx   . Prostate cancer Neg Hx   . Sickle cell trait Neg Hx   . Tuberculosis Neg Hx     Past medical history, surgical history, medications, allergies, family history and social history reviewed with patient today and changes made to appropriate areas of the chart.   Review of Systems - General ROS: negative Psychological ROS: negative Ophthalmic ROS: negative ENT ROS: negative Allergy and Immunology ROS: negative Hematological and Lymphatic ROS: negative Endocrine ROS: negative Breast ROS: negative for breast lumps Respiratory ROS: no cough, shortness of breath, or wheezing Cardiovascular ROS: no chest pain or dyspnea on exertion Gastrointestinal ROS: no abdominal pain, change in bowel habits, or black or bloody stools Genito-Urinary ROS: no dysuria, trouble voiding, or hematuria Musculoskeletal ROS: negative Neurological ROS: no TIA or stroke symptoms Dermatological ROS: negative All other ROS negative except what is listed above and in the HPI.      Objective:    BP 132/89   Pulse (!) 103   Temp 98.8 F (37.1 C) (Oral)   Ht 5\' 3"  (1.6 m)   Wt 116 lb 3.2 oz (52.7 kg)   LMP 04/28/2019 (Exact Date)   SpO2 100%   BMI 20.58 kg/m   Wt Readings from Last 3 Encounters:  05/21/19 116 lb 3.2 oz (52.7 kg)  04/12/19 117 lb (53.1 kg)  12/02/18 121 lb  12.8 oz (55.2 kg)    Physical Exam Vitals signs and nursing note reviewed.  Constitutional:      General: She is not in acute distress.    Appearance: She  is well-developed.  HENT:     Head: Atraumatic.     Right Ear: External ear normal.     Left Ear: External ear normal.     Nose: Nose normal.     Mouth/Throat:     Pharynx: No oropharyngeal exudate.  Eyes:     General: No scleral icterus.    Conjunctiva/sclera: Conjunctivae normal.     Pupils: Pupils are equal, round, and reactive to light.  Neck:     Musculoskeletal: Normal range of motion and neck supple.     Thyroid: No thyromegaly.  Cardiovascular:     Rate and Rhythm: Normal rate and regular rhythm.     Heart sounds: Normal heart sounds.  Pulmonary:     Effort: Pulmonary effort is normal. No respiratory distress.     Breath sounds: Normal breath sounds.  Chest:     Breasts:        Right: No mass, nipple discharge or skin change.        Left: No mass, nipple discharge or skin change.  Abdominal:     General: Bowel sounds are normal.     Palpations: Abdomen is soft. There is no mass.     Tenderness: There is no abdominal tenderness.  Genitourinary:    Comments: GU exam declined Musculoskeletal: Normal range of motion.        General: No tenderness.  Lymphadenopathy:     Cervical: No cervical adenopathy.  Skin:    General: Skin is warm and dry.     Findings: No rash.  Neurological:     Mental Status: She is alert and oriented to person, place, and time.     Cranial Nerves: No cranial nerve deficit.  Psychiatric:        Behavior: Behavior normal.     Results for orders placed or performed in visit on 05/21/19  Microscopic Examination   URINE  Result Value Ref Range   WBC, UA 0-5 0 - 5 /hpf   RBC 3-10 (A) 0 - 2 /hpf   Epithelial Cells (non renal) 0-10 0 - 10 /hpf   Bacteria, UA Moderate (A) None seen/Few  Urine Culture, Reflex   URINE  Result Value Ref Range   Urine Culture, Routine Final report     Organism ID, Bacteria Comment   CBC with Differential/Platelet  Result Value Ref Range   WBC 6.2 3.4 - 10.8 x10E3/uL   RBC 4.48 3.77 - 5.28 x10E6/uL   Hemoglobin 13.7 11.1 - 15.9 g/dL   Hematocrit 41.3 34.0 - 46.6 %   MCV 92 79 - 97 fL   MCH 30.6 26.6 - 33.0 pg   MCHC 33.2 31.5 - 35.7 g/dL   RDW 11.8 11.7 - 15.4 %   Platelets 148 (L) 150 - 450 x10E3/uL   Neutrophils 64 Not Estab. %   Lymphs 27 Not Estab. %   Monocytes 6 Not Estab. %   Eos 2 Not Estab. %   Basos 1 Not Estab. %   Neutrophils Absolute 4.0 1.4 - 7.0 x10E3/uL   Lymphocytes Absolute 1.6 0.7 - 3.1 x10E3/uL   Monocytes Absolute 0.4 0.1 - 0.9 x10E3/uL   EOS (ABSOLUTE) 0.1 0.0 - 0.4 x10E3/uL   Basophils Absolute 0.0 0.0 - 0.2 x10E3/uL   Immature Granulocytes 0 Not Estab. %   Immature Grans (Abs) 0.0 0.0 - 0.1 x10E3/uL  Comprehensive metabolic panel  Result Value Ref Range   Glucose 88 65 - 99 mg/dL   BUN 12 6 - 20  mg/dL   Creatinine, Ser 0.68 0.57 - 1.00 mg/dL   GFR calc non Af Amer 117 >59 mL/min/1.73   GFR calc Af Amer 135 >59 mL/min/1.73   BUN/Creatinine Ratio 18 9 - 23   Sodium 140 134 - 144 mmol/L   Potassium 4.1 3.5 - 5.2 mmol/L   Chloride 101 96 - 106 mmol/L   CO2 23 20 - 29 mmol/L   Calcium 9.5 8.7 - 10.2 mg/dL   Total Protein 7.2 6.0 - 8.5 g/dL   Albumin 4.8 3.8 - 4.8 g/dL   Globulin, Total 2.4 1.5 - 4.5 g/dL   Albumin/Globulin Ratio 2.0 1.2 - 2.2   Bilirubin Total 0.8 0.0 - 1.2 mg/dL   Alkaline Phosphatase 43 39 - 117 IU/L   AST 11 0 - 40 IU/L   ALT 9 0 - 32 IU/L  Lipid Panel w/o Chol/HDL Ratio  Result Value Ref Range   Cholesterol, Total 150 100 - 199 mg/dL   Triglycerides 52 0 - 149 mg/dL   HDL 56 >39 mg/dL   VLDL Cholesterol Cal 10 5 - 40 mg/dL   LDL Calculated 84 0 - 99 mg/dL  TSH  Result Value Ref Range   TSH 1.560 0.450 - 4.500 uIU/mL  UA/M w/rflx Culture, Routine   Specimen: Urine   URINE  Result Value Ref Range   Specific Gravity, UA 1.025 1.005 - 1.030   pH, UA 7.0 5.0 - 7.5    Color, UA Yellow Yellow   Appearance Ur Clear Clear   Leukocytes,UA Negative Negative   Protein,UA Negative Negative/Trace   Glucose, UA Negative Negative   Ketones, UA Negative Negative   RBC, UA 2+ (A) Negative   Bilirubin, UA Negative Negative   Urobilinogen, Ur 0.2 0.2 - 1.0 mg/dL   Nitrite, UA Negative Negative   Microscopic Examination See below:    Urinalysis Reflex Comment       Assessment & Plan:   Problem List Items Addressed This Visit    None    Visit Diagnoses    Annual physical exam    -  Primary   Relevant Orders   CBC with Differential/Platelet (Completed)   Comprehensive metabolic panel (Completed)   Lipid Panel w/o Chol/HDL Ratio (Completed)   TSH (Completed)   UA/M w/rflx Culture, Routine (Completed)   Microscopic Examination (Completed)   Urine Culture, Reflex (Completed)   Need for Tdap vaccination       Relevant Orders   Tdap vaccine greater than or equal to 7yo IM (Completed)       Follow up plan: Return in about 1 year (around 05/20/2020) for CPE.   LABORATORY TESTING:  - Pap smear: up to date  IMMUNIZATIONS:   - Tdap: Tetanus vaccination status reviewed: last tetanus booster within 10 years. - Influenza: Postponed to flu season  PATIENT COUNSELING:   Advised to take 1 mg of folate supplement per day if capable of pregnancy.   Sexuality: Discussed sexually transmitted diseases, partner selection, use of condoms, avoidance of unintended pregnancy  and contraceptive alternatives.   Advised to avoid cigarette smoking.  I discussed with the patient that most people either abstain from alcohol or drink within safe limits (<=14/week and <=4 drinks/occasion for males, <=7/weeks and <= 3 drinks/occasion for females) and that the risk for alcohol disorders and other health effects rises proportionally with the number of drinks per week and how often a drinker exceeds daily limits.  Discussed cessation/primary prevention of drug use and availability  of treatment  for abuse.   Diet: Encouraged to adjust caloric intake to maintain  or achieve ideal body weight, to reduce intake of dietary saturated fat and total fat, to limit sodium intake by avoiding high sodium foods and not adding table salt, and to maintain adequate dietary potassium and calcium preferably from fresh fruits, vegetables, and low-fat dairy products.    stressed the importance of regular exercise  Injury prevention: Discussed safety belts, safety helmets, smoke detector, smoking near bedding or upholstery.   Dental health: Discussed importance of regular tooth brushing, flossing, and dental visits.    NEXT PREVENTATIVE PHYSICAL DUE IN 1 YEAR. Return in about 1 year (around 05/20/2020) for CPE.

## 2019-05-21 NOTE — Patient Instructions (Signed)
https://www.cdc.gov/vaccines/hcp/vis/vis-statements/tdap.pdf">  Tdap Vaccine (Tetanus, Diphtheria and Pertussis): What You Need to Know 1. Why get vaccinated? Tetanus, diphtheria and pertussis are very serious diseases. Tdap vaccine can protect [place] from these diseases. And, Tdap vaccine given to pregnant women can protect newborn babies against pertussis.Marland Kitchen TETANUS (Lockjaw) is rare in the [place] today. It causes painful muscle tightening and stiffness, usually all over the body.  It can lead to tightening of muscles in the head and neck so you can't open your mouth, swallow, or sometimes even breathe. Tetanus kills about 1 out of 10 people who are infected even after receiving the best medical care. DIPHTHERIA is also rare in the [place] today. It can cause a thick coating to form in the back of the throat.  It can lead to breathing problems, heart failure, paralysis, and death. PERTUSSIS (Whooping Cough) causes severe coughing spells, which can cause difficulty breathing, vomiting and disturbed sleep.  It can also lead to weight loss, incontinence, and rib fractures. Up to 2 in 100 adolescents and 5 in 100 adults with pertussis are hospitalized or have complications, which could include pneumonia or death. These diseases are caused by bacteria. Diphtheria and pertussis are spread from person to person through secretions from coughing or sneezing. Tetanus enters the body through cuts, scratches, or wounds. Before vaccines, as many as 200,000 cases of diphtheria, 200,000 cases of pertussis, and hundreds of cases of tetanus, were reported in the [place] each year. Since vaccination began, reports of cases for tetanus and diphtheria have dropped by about 99% and for pertussis by about 80%. 2. Tdap vaccine Tdap vaccine can protect adolescents and adults from tetanus, diphtheria, and pertussis. One dose of Tdap is routinely given at age 15 or 45. People who did not get Tdap at that age  should get it as soon as possible. Tdap is especially important for healthcare professionals and anyone having close contact with a baby younger than 12 months. Pregnant women should get a dose of Tdap during every pregnancy, to protect the newborn from pertussis. Infants are most at risk for severe, life-threatening complications from pertussis. Another vaccine, called Td, protects against tetanus and diphtheria, but not pertussis. A Td booster should be given every 10 years. Tdap may be given as one of these boosters if you have never gotten Tdap before. Tdap may also be given after a severe cut or burn to prevent tetanus infection. Your doctor or the person giving you the vaccine can give you more information. Tdap may safely be given at the same time as other vaccines. 3. Some people should not get this vaccine  A person who has ever had a life-threatening allergic reaction after a previous dose of any diphtheria, tetanus or pertussis containing vaccine, OR has a severe allergy to any part of this vaccine, should not get Tdap vaccine. Tell the person giving the vaccine about any severe allergies.  Anyone who had coma or long repeated seizures within 7 days after a childhood dose of DTP or DTaP, or a previous dose of Tdap, should not get Tdap, unless a cause other than the vaccine was found. They can still get Td.  Talk to your doctor if you: ? have seizures or another nervous system problem, ? had severe pain or swelling after any vaccine containing diphtheria, tetanus or pertussis, ? ever had a condition called Guillain-Barr Syndrome (GBS), ? aren't feeling well on the day the shot is scheduled. 4. Risks With any medicine, including vaccines,  there is a chance of side effects. These are usually mild and go away on their own. Serious reactions are also possible but are rare. Most people who get Tdap vaccine do not have any problems with it. Mild problems following Tdap (Did not interfere  with activities)  Pain where the shot was given (about 3 in 4 adolescents or 2 in 3 adults)  Redness or swelling where the shot was given (about 1 person in 5)  Mild fever of at least 100.43F (up to about 1 in 25 adolescents or 1 in 100 adults)  Headache (about 3 or 4 people in 10)  Tiredness (about 1 person in 3 or 4)  Nausea, vomiting, diarrhea, stomach ache (up to 1 in 4 adolescents or 1 in 10 adults)  Chills, sore joints (about 1 person in 10)  Body aches (about 1 person in 3 or 4)  Rash, swollen glands (uncommon) Moderate problems following Tdap (Interfered with activities, but did not require medical attention)  Pain where the shot was given (up to 1 in 5 or 6)  Redness or swelling where the shot was given (up to about 1 in 16 adolescents or 1 in 12 adults)  Fever over 102F (about 1 in 100 adolescents or 1 in 250 adults)  Headache (about 1 in 7 adolescents or 1 in 10 adults)  Nausea, vomiting, diarrhea, stomach ache (up to 1 or 3 people in 100)  Swelling of the entire arm where the shot was given (up to about 1 in 500). Severe problems following Tdap (Unable to perform usual activities; required medical attention)  Swelling, severe pain, bleeding and redness in the arm where the shot was given (rare). Problems that could happen after any vaccine:  People sometimes faint after a medical procedure, including vaccination. Sitting or lying down for about 15 minutes can help prevent fainting, and injuries caused by a fall. Tell your doctor if you feel dizzy, or have vision changes or ringing in the ears.  Some people get severe pain in the shoulder and have difficulty moving the arm where a shot was given. This happens very rarely.  Any medication can cause a severe allergic reaction. Such reactions from a vaccine are very rare, estimated at fewer than 1 in a million doses, and would happen within a few minutes to a few hours after the vaccination. As with any medicine,  there is a very remote chance of a vaccine causing a serious injury or death. The safety of vaccines is always being monitored. For more information, visit: http://www.aguilar.org/ 5. What if there is a serious problem? What should I look for?  Look for anything that concerns you, such as signs of a severe allergic reaction, very high fever, or unusual behavior. Signs of a severe allergic reaction can include hives, swelling of the face and throat, difficulty breathing, a fast heartbeat, dizziness, and weakness. These would usually start a few minutes to a few hours after the vaccination. What should I do?  If you think it is a severe allergic reaction or other emergency that can't wait, call 9-1-1 or get the person to the nearest hospital. Otherwise, call your doctor.  Afterward, the reaction should be reported to the Vaccine Adverse Event Reporting System (VAERS). Your doctor might file this report, or you can do it yourself through the VAERS web site at www.vaers.SamedayNews.es, or by calling 620-582-4851. VAERS does not give medical advice. 6. The National Vaccine Injury Compensation Program The [employer] Vaccine Injury Compensation Program (  VICP) is a federal program that was created to compensate people who may have been injured by certain vaccines. Persons who believe they may have been injured by a vaccine can learn about the program and about filing a claim by calling (703) 497-2049 or visiting the [place] website at GoldCloset.com.ee. There is a time limit to file a claim for compensation. 7. How can I learn more?  Ask your doctor. He or she can give you the vaccine package insert or suggest other sources of information.  Call your local or state health department.  Contact the Centers for Disease Control and Prevention (CDC): ? Call 434-734-0958 (1-800-CDC-INFO) or ? Visit CDC's website at http://hunter.com/ Vaccine Information Statement Tdap Vaccine (11/30/2013) This  information is not intended to replace advice given to you by your health care provider. Make sure you discuss any questions you have with your health care provider. Document Released: 03/24/2012 Document Revised: 05/11/2018 Document Reviewed: 05/11/2018 Elsevier Interactive Patient Education  [employer].

## 2019-05-22 LAB — LIPID PANEL W/O CHOL/HDL RATIO
Cholesterol, Total: 150 mg/dL (ref 100–199)
HDL: 56 mg/dL (ref >39)
LDL Calculated: 84 mg/dL (ref 0–99)
Triglycerides: 52 mg/dL (ref 0–149)
VLDL Cholesterol Cal: 10 mg/dL (ref 5–40)

## 2019-05-22 LAB — COMPREHENSIVE METABOLIC PANEL
ALT: 9 IU/L (ref 0–32)
AST: 11 IU/L (ref 0–40)
Albumin/Globulin Ratio: 2 (ref 1.2–2.2)
Albumin: 4.8 g/dL (ref 3.8–4.8)
Alkaline Phosphatase: 43 IU/L (ref 39–117)
BUN/Creatinine Ratio: 18 (ref 9–23)
BUN: 12 mg/dL (ref 6–20)
Bilirubin Total: 0.8 mg/dL (ref 0.0–1.2)
CO2: 23 mmol/L (ref 20–29)
Calcium: 9.5 mg/dL (ref 8.7–10.2)
Chloride: 101 mmol/L (ref 96–106)
Creatinine, Ser: 0.68 mg/dL (ref 0.57–1.00)
GFR calc Af Amer: 135 mL/min/{1.73_m2} (ref >59)
GFR calc non Af Amer: 117 mL/min/{1.73_m2} (ref >59)
Globulin, Total: 2.4 g/dL (ref 1.5–4.5)
Glucose: 88 mg/dL (ref 65–99)
Potassium: 4.1 mmol/L (ref 3.5–5.2)
Sodium: 140 mmol/L (ref 134–144)
Total Protein: 7.2 g/dL (ref 6.0–8.5)

## 2019-05-22 LAB — CBC WITH DIFFERENTIAL/PLATELET
Basophils Absolute: 0 10*3/uL (ref 0.0–0.2)
Basos: 1 %
EOS (ABSOLUTE): 0.1 10*3/uL (ref 0.0–0.4)
Eos: 2 %
Hematocrit: 41.3 % (ref 34.0–46.6)
Hemoglobin: 13.7 g/dL (ref 11.1–15.9)
Immature Grans (Abs): 0 10*3/uL (ref 0.0–0.1)
Immature Granulocytes: 0 %
Lymphocytes Absolute: 1.6 10*3/uL (ref 0.7–3.1)
Lymphs: 27 %
MCH: 30.6 pg (ref 26.6–33.0)
MCHC: 33.2 g/dL (ref 31.5–35.7)
MCV: 92 fL (ref 79–97)
Monocytes Absolute: 0.4 10*3/uL (ref 0.1–0.9)
Monocytes: 6 %
Neutrophils Absolute: 4 10*3/uL (ref 1.4–7.0)
Neutrophils: 64 %
Platelets: 148 10*3/uL — ABNORMAL LOW (ref 150–450)
RBC: 4.48 x10E6/uL (ref 3.77–5.28)
RDW: 11.8 % (ref 11.7–15.4)
WBC: 6.2 10*3/uL (ref 3.4–10.8)

## 2019-05-22 LAB — TSH: TSH: 1.56 u[IU]/mL (ref 0.450–4.500)

## 2019-05-23 LAB — UA/M W/RFLX CULTURE, ROUTINE
Bilirubin, UA: NEGATIVE
Glucose, UA: NEGATIVE
Ketones, UA: NEGATIVE
Leukocytes,UA: NEGATIVE
Nitrite, UA: NEGATIVE
Protein,UA: NEGATIVE
Specific Gravity, UA: 1.025 (ref 1.005–1.030)
Urobilinogen, Ur: 0.2 mg/dL (ref 0.2–1.0)
pH, UA: 7 (ref 5.0–7.5)

## 2019-05-23 LAB — URINE CULTURE, REFLEX

## 2019-05-23 LAB — MICROSCOPIC EXAMINATION

## 2019-05-31 ENCOUNTER — Encounter: Payer: Self-pay | Admitting: Family Medicine

## 2019-06-02 ENCOUNTER — Telehealth: Payer: Self-pay | Admitting: Family Medicine

## 2019-06-02 NOTE — Telephone Encounter (Signed)
Needs appt

## 2019-06-03 NOTE — Telephone Encounter (Signed)
No appt available, ok wait until 06/10/2019. Please advise.

## 2019-06-03 NOTE — Telephone Encounter (Signed)
Ok to wait, but could also book her at 3 tomorrow for 30 min slot or sometimes Wed afternoon or with someone else who may have some availability as well

## 2019-06-03 NOTE — Telephone Encounter (Signed)
LVM for pt.

## 2019-06-09 ENCOUNTER — Other Ambulatory Visit: Payer: Self-pay

## 2019-06-09 ENCOUNTER — Encounter: Payer: Self-pay | Admitting: Family Medicine

## 2019-06-09 ENCOUNTER — Ambulatory Visit (INDEPENDENT_AMBULATORY_CARE_PROVIDER_SITE_OTHER): Payer: BLUE CROSS/BLUE SHIELD | Admitting: Family Medicine

## 2019-06-09 VITALS — BP 117/77 | HR 115 | Temp 99.2°F | Ht 63.0 in | Wt 116.0 lb

## 2019-06-09 DIAGNOSIS — R42 Dizziness and giddiness: Secondary | ICD-10-CM | POA: Diagnosis not present

## 2019-06-09 DIAGNOSIS — R000 Tachycardia, unspecified: Secondary | ICD-10-CM | POA: Diagnosis not present

## 2019-06-09 DIAGNOSIS — R531 Weakness: Secondary | ICD-10-CM

## 2019-06-09 DIAGNOSIS — R1013 Epigastric pain: Secondary | ICD-10-CM | POA: Diagnosis not present

## 2019-06-09 NOTE — Progress Notes (Signed)
BP 117/77   Pulse (!) 115   Temp 99.2 F (37.3 C) (Oral)   Ht 5\' 3"  (1.6 m)   Wt 116 lb (52.6 kg)   SpO2 99%   BMI 20.55 kg/m    Subjective:    Patient ID: Sherry Park, female    DOB: [birth year], 33 y.o.   MRN: [id number]  HPI: Sherry Park is a 33 y.o. female  Chief Complaint  Patient presents with  . Dizziness    pt requests blood work to check her sugars and iron. symptoms ongoing for a few weeks now  . Fatigue   Patient presenting today concerned about ongoing episodes she's been having intermittently the past 3 or so years. States she will have times where she becomes tachycardic, dizzy, weak, and sweaty. Now the past few weeks states she feels weak and tired all the time. Has been worked up in the past by Cardiology for her tachycardia with holter monitor and echocardiogram all which were reassuring. Concerned about reactive hypoglycemia after doing some research. Being worked up at [facility] [employer] with GI currently for epigastric discomfort, chronic bloating and nausea. Diagnosed with functional dyspepsia and was not found to have celiac, crohn's, UC, or significant vitamin/absorption issues at this point. Still struggling with sxs and notes she mostly sticks to simple carbs and small portions because her system seems to like these best. Also has known hx of anxiety currently off medications as she thought they may have been causing her GI issues but those have persisted despite d/c over a year ago. Denies syncope, CP, SOB, mental status changes.   Relevant past medical, surgical, family and social history reviewed and updated as indicated. Interim medical history since our last visit reviewed. Allergies and medications reviewed and updated.  Review of Systems  Per HPI unless specifically indicated above     Objective:    BP 117/77   Pulse (!) 115   Temp 99.2 F (37.3 C) (Oral)   Ht 5\' 3"  (1.6 m)   Wt 116 lb (52.6 kg)   SpO2 99%   BMI 20.55 kg/m   Wt Readings from  Last 3 Encounters:  06/09/19 116 lb (52.6 kg)  05/21/19 116 lb 3.2 oz (52.7 kg)  04/12/19 117 lb (53.1 kg)    Physical Exam Vitals signs and nursing note reviewed.  Constitutional:      Appearance: Normal appearance. She is not ill-appearing.  HENT:     Head: Atraumatic.  Eyes:     Extraocular Movements: Extraocular movements intact.     Conjunctiva/sclera: Conjunctivae normal.  Neck:     Musculoskeletal: Normal range of motion and neck supple.  Cardiovascular:     Rate and Rhythm: Regular rhythm. Tachycardia present.     Heart sounds: Normal heart sounds.  Pulmonary:     Effort: Pulmonary effort is normal.     Breath sounds: Normal breath sounds.  Abdominal:     General: Bowel sounds are normal.     Palpations: Abdomen is soft.     Tenderness: There is no abdominal tenderness. There is no right CVA tenderness, left CVA tenderness or guarding.  Musculoskeletal: Normal range of motion.  Skin:    General: Skin is warm and dry.  Neurological:     Mental Status: She is alert and oriented to person, place, and time.  Psychiatric:        Mood and Affect: Mood normal.        Thought Content: Thought content normal.  Judgment: Judgment normal.     Results for orders placed or performed in visit on 06/09/19  Ferritin  Result Value Ref Range   Ferritin 56 15 - 150 ng/mL  Iron Binding Cap (TIBC)([facility])  Result Value Ref Range   Total Iron Binding Capacity 336 250 - 450 ug/dL   UIBC 165 131 - 425 ug/dL   Iron 171 (H) 27 - 159 ug/dL   Iron Saturation 51 15 - 55 %  Vitamin B12  Result Value Ref Range   Vitamin B-12 490 232 - 1,245 pg/mL  Folate  Result Value Ref Range   Folate >20.0 >3.0 ng/mL  CBC with Differential/Platelet  Result Value Ref Range   WBC 5.8 3.4 - 10.8 x10E3/uL   RBC 4.45 3.77 - 5.28 x10E6/uL   Hemoglobin 13.8 11.1 - 15.9 g/dL   Hematocrit 40.8 34.0 - 46.6 %   MCV 92 79 - 97 fL   MCH 31.0 26.6 - 33.0 pg   MCHC 33.8 31.5 - 35.7 g/dL    RDW 11.8 11.7 - 15.4 %   Platelets 196 150 - 450 x10E3/uL   Neutrophils 65 Not Estab. %   Lymphs 26 Not Estab. %   Monocytes 7 Not Estab. %   Eos 1 Not Estab. %   Basos 1 Not Estab. %   Neutrophils Absolute 3.8 1.4 - 7.0 x10E3/uL   Lymphocytes Absolute 1.5 0.7 - 3.1 x10E3/uL   Monocytes Absolute 0.4 0.1 - 0.9 x10E3/uL   EOS (ABSOLUTE) 0.1 0.0 - 0.4 x10E3/uL   Basophils Absolute 0.1 0.0 - 0.2 x10E3/uL   Immature Granulocytes 0 Not Estab. %   Immature Grans (Abs) 0.0 0.0 - 0.1 [id number]  Basic metabolic panel  Result Value Ref Range   Glucose 91 65 - 99 mg/dL   BUN 12 6 - 20 mg/dL   Creatinine, Ser 0.70 0.57 - 1.00 mg/dL   GFR calc non Af Amer 116 >59 mL/min/1.73   GFR calc Af Amer 134 >59 mL/min/1.73   BUN/Creatinine Ratio 17 9 - 23   Sodium 141 134 - 144 mmol/L   Potassium 3.9 3.5 - 5.2 mmol/L   Chloride 103 96 - 106 mmol/L   CO2 22 20 - 29 mmol/L   Calcium 9.7 8.7 - 10.2 mg/dL      Assessment & Plan:   Problem List Items Addressed This Visit      Other   Tachycardia    Reviewed Cardiology note from workup in 2017. BBlocker was considered at this time. Patient does not wish to start a medication at this point but will consider and continue to monitor. Unclear how much of this is her uncontrolled anxiety vs other etiology      Relevant Orders   CBC with Differential/Platelet (Completed)   Basic metabolic panel (Completed)   Dyspepsia    Followed by GI, continue per their recommendations.        Other Visit Diagnoses    Weakness    -  Primary   Will check vitamin and mineral levels and other basic labs. Discussed better glycemic control with low GI diet rather than majorly simple carbs   Relevant Orders   Ferritin (Completed)   Iron Binding Cap (TIBC)([facility]) (Completed)   Vitamin B12 (Completed)   Folate (Completed)   Dizziness       Relevant Orders   CBC with Differential/Platelet (Completed)   Basic metabolic panel (Completed)       Follow up  plan: Return for CPE.

## 2019-06-10 ENCOUNTER — Encounter: Payer: Self-pay | Admitting: Family Medicine

## 2019-06-10 LAB — BASIC METABOLIC PANEL
BUN/Creatinine Ratio: 17 (ref 9–23)
BUN: 12 mg/dL (ref 6–20)
CO2: 22 mmol/L (ref 20–29)
Calcium: 9.7 mg/dL (ref 8.7–10.2)
Chloride: 103 mmol/L (ref 96–106)
Creatinine, Ser: 0.7 mg/dL (ref 0.57–1.00)
GFR calc Af Amer: 134 mL/min/{1.73_m2} (ref >59)
GFR calc non Af Amer: 116 mL/min/{1.73_m2} (ref >59)
Glucose: 91 mg/dL (ref 65–99)
Potassium: 3.9 mmol/L (ref 3.5–5.2)
Sodium: 141 mmol/L (ref 134–144)

## 2019-06-10 LAB — IRON AND TIBC
Iron Saturation: 51 % (ref 15–55)
Iron: 171 ug/dL — ABNORMAL HIGH (ref 27–159)
Total Iron Binding Capacity: 336 ug/dL (ref 250–450)
UIBC: 165 ug/dL (ref 131–425)

## 2019-06-10 LAB — CBC WITH DIFFERENTIAL/PLATELET
Basophils Absolute: 0.1 10*3/uL (ref 0.0–0.2)
Basos: 1 %
EOS (ABSOLUTE): 0.1 10*3/uL (ref 0.0–0.4)
Eos: 1 %
Hematocrit: 40.8 % (ref 34.0–46.6)
Hemoglobin: 13.8 g/dL (ref 11.1–15.9)
Immature Grans (Abs): 0 10*3/uL (ref 0.0–0.1)
Immature Granulocytes: 0 %
Lymphocytes Absolute: 1.5 10*3/uL (ref 0.7–3.1)
Lymphs: 26 %
MCH: 31 pg (ref 26.6–33.0)
MCHC: 33.8 g/dL (ref 31.5–35.7)
MCV: 92 fL (ref 79–97)
Monocytes Absolute: 0.4 10*3/uL (ref 0.1–0.9)
Monocytes: 7 %
Neutrophils Absolute: 3.8 10*3/uL (ref 1.4–7.0)
Neutrophils: 65 %
Platelets: 196 10*3/uL (ref 150–450)
RBC: 4.45 x10E6/uL (ref 3.77–5.28)
RDW: 11.8 % (ref 11.7–15.4)
WBC: 5.8 10*3/uL (ref 3.4–10.8)

## 2019-06-10 LAB — VITAMIN B12: Vitamin B-12: 490 pg/mL (ref 232–1245)

## 2019-06-10 LAB — FOLATE: Folate: >20 ng/mL (ref >3.0)

## 2019-06-10 LAB — FERRITIN: Ferritin: 56 ng/mL (ref 15–150)

## 2019-06-14 ENCOUNTER — Encounter: Payer: Self-pay | Admitting: Family Medicine

## 2019-06-14 NOTE — Assessment & Plan Note (Signed)
Followed by GI, continue per their recommendations.

## 2019-06-14 NOTE — Assessment & Plan Note (Addendum)
Reviewed Cardiology note from workup in 2017. BBlocker was considered at this time. Patient does not wish to start a medication at this point but will consider and continue to monitor. Unclear how much of this is her uncontrolled anxiety vs other etiology

## 2019-06-17 ENCOUNTER — Other Ambulatory Visit: Payer: Self-pay | Admitting: Family Medicine

## 2019-06-17 DIAGNOSIS — R000 Tachycardia, unspecified: Secondary | ICD-10-CM

## 2019-06-17 DIAGNOSIS — R42 Dizziness and giddiness: Secondary | ICD-10-CM

## 2019-06-17 DIAGNOSIS — R531 Weakness: Secondary | ICD-10-CM

## 2019-06-28 ENCOUNTER — Telehealth: Payer: Self-pay | Admitting: Cardiovascular Disease

## 2019-06-28 NOTE — Telephone Encounter (Signed)
Pt c/o BP issue: STAT if pt c/o blurred vision, one-sided weakness or slurred speech  1. What are your last 5 BP readings?  Lowest 88/58 Averages 105/70 Seems to drop when she stands up  2. Are you having any other symptoms (ex. Dizziness, headache, blurred vision, passed out)? Dizziness, fast HR (145), extremely tired  3. What is your BP issue? Too low

## 2019-06-28 NOTE — Telephone Encounter (Signed)
Spoke with the patient. Patient sts that she has been having episodes of dizziness, low BP, fast heartrate. The dizziness and tach occurs after meals.  The patient was last seen by Dr. Fletcher Anon in Dec 2017. Adv the patient that she would need to be seen for evaluation.  Patient sts that she was seen by her pcp and she mentioned that the patient may have "pots".  Appt scheduled with Ignacia Bayley, NP @ 12pm. Patient is aware of the appt date and time.  Adv the patient to stay well hydrated, and f/u wit her pcp in the meantime for any reoccurring symptoms.  Patient verbalized understanding and voiced appreciation for the call.

## 2019-07-07 ENCOUNTER — Ambulatory Visit (INDEPENDENT_AMBULATORY_CARE_PROVIDER_SITE_OTHER): Payer: BLUE CROSS/BLUE SHIELD | Admitting: Nurse Practitioner

## 2019-07-07 ENCOUNTER — Other Ambulatory Visit: Payer: Self-pay

## 2019-07-07 ENCOUNTER — Encounter: Payer: Self-pay | Admitting: Nurse Practitioner

## 2019-07-07 VITALS — BP 106/60 | HR 87 | Temp 97.3°F | Ht 63.0 in | Wt 117.2 lb

## 2019-07-07 DIAGNOSIS — I498 Other specified cardiac arrhythmias: Secondary | ICD-10-CM

## 2019-07-07 DIAGNOSIS — I951 Orthostatic hypotension: Secondary | ICD-10-CM | POA: Diagnosis not present

## 2019-07-07 DIAGNOSIS — R000 Tachycardia, unspecified: Secondary | ICD-10-CM

## 2019-07-07 DIAGNOSIS — G90A Postural orthostatic tachycardia syndrome (POTS): Secondary | ICD-10-CM

## 2019-07-07 NOTE — Patient Instructions (Signed)
Medication Instructions:  Your physician recommends that you continue on your current medications as directed. Please refer to the Current Medication list given to you today.  If you need a refill on your cardiac medications before your next appointment, please call your pharmacy.   Lab work: None ordered  If you have labs (blood work) drawn today and your tests are completely normal, you will receive your results only by: Marland Kitchen MyChart Message (if you have MyChart) OR . A paper copy in the mail If you have any lab test that is abnormal or we need to change your treatment, we will call you to review the results.  Testing/Procedures: None ordered   Follow-Up: At [facility], you and your health needs are our priority.  As part of our continuing mission to provide you with exceptional heart care, we have created designated Provider Care Teams.  These Care Teams include your primary Cardiologist (physician) and Advanced Practice Providers (APPs -  Physician Assistants and Nurse Practitioners) who all work together to provide you with the care you need, when you need it. You will need a follow up appointment in as needed.  Please call our office 2 months in advance to schedule this appointment.  You may see Kathlyn Sacramento, MD or one of the following Advanced Practice Providers on your designated Care Team:   Murray Hodgkins, NP Christell Faith, PA-C . Marrianne Mood, PA-C  Any Other Special Instructions Will Be Listed Below (If Applicable). 1- Wear knee high compression socks/hose 2- Change positions slowly. 3- Decrease salt intake

## 2019-07-07 NOTE — Progress Notes (Signed)
Office Visit    Patient Name: Sherry Park Date of Encounter: 07/07/2019  Primary Care Provider:  Volney American, PA-C Primary Cardiologist:  Kathlyn Sacramento, MD  Chief Complaint    33 year old female with a history of palpitations and anxiety, who presents for follow-up due to dizziness and palpitations.  Past Medical History    Past Medical History:  Diagnosis Date   Anxiety    Headache    a. felt to be 2/2 Dostinex   Hemorrhoids    Hyperprolactinemia ([place])    Microprolactinoma ([place])    a. on Dostinex; b. followed by Dr. Gabriel Carina, MD   Palpitations    a. 04/2016 Event Monitor: 1 episode of narrow complex tachycardia @ 163 - likely sinus tach though SVT could not be excluded (happened during "anxiety and panic attack"); b. 04/2016 Echo: EF 60-65%, no rwma. Nl LA size. Nl RV fxn.   PCOS (polycystic ovarian syndrome)    Past Surgical History:  Procedure Laterality Date   ESOPHAGOGASTRODUODENOSCOPY (EGD) WITH PROPOFOL N/A 09/24/2018   Procedure: ESOPHAGOGASTRODUODENOSCOPY (EGD) WITH PROPOFOL;  Surgeon: Lin Landsman, MD;  Location: [facility] ENDOSCOPY;  Service: Gastroenterology;  Laterality: N/A;   NO PAST SURGERIES     verified with patient- 09/09/17    Allergies  Allergies  Allergen Reactions   Doxycycline Other (See Comments)    Thrush and rash   Latex Itching   Pantoprazole Other (See Comments)    abdominal pain    History of Present Illness    33 year old female with a prior history of palpitations and anxiety.  She was previously evaluated in July 2017 with an echocardiogram which showed normal LV function and event monitoring which showed 1 episode of narrow complex tachycardia which was most likely felt to be sinus tachycardia.  That event occurred during the report of "anxiety and panic attack."  She was last seen in clinic in December 2017 at which point was felt that palpitations could possibly be secondary to inappropriate sinus  tachycardia worsened by underlying stress and anxiety.  Beta-blocker was offered but she was able to control her symptoms without need for intervention at that time.  She says that since her visit 3 years ago, she has had intermittent "flares" of lightheadedness, weakness, and fatigue that occur every few months, can last weeks to months at a time, and resolve spontaneously.  Flares occur in the setting of baseline elevated heart rates and intermittent palpitations, and she does not necessarily notice that palpitations or elevated heart rates worsen during flares.  She can also go long stretches of time feeling well.  She works at [employer] as a Scientist, water quality.  She says she frequently moves from sitting to standing position throughout the day.  Given her history of sinus tachycardia and lightheadedness, she is accustomed to salting her food.  She has never tried compression socks.   She was feeling well when she had her annual visit with her primary care provider in August but then began to note daily generalized malaise and fatigue.  She says that her extremities feel heavy.  These symptoms are typical of a flare for her and have persisted ever since early August.  Lab work in early August was unremarkable with normal CBC, basic metabolic panel, and TSH.  She has continued to note elevations in heart rates though not necessarily more so than what would be typical for her.  The fastest heart rate she documented at home was 145 bpm.  She has sometimes checked her blood pressure at home and notes that she is frequently less than [id number] systolic, especially after eating.  She denies chest pain, dyspnea, syncope, edema, or early satiety.  She has occasionally noted brief episodes of dyspnea when lying in bed at night, though she thinks that this occurs while she is dozing off to sleep and awakens following a snore.  Symptom resolves with repositioning and lying on her side.  Home Medications    Prior to  Admission medications   Medication Sig Start Date End Date Taking? Authorizing Provider  cabergoline (DOSTINEX) 0.5 MG tablet Take 0.25 mg by mouth 2 (two) times a week.  03/10/18  Yes [provider]  ERRIN 0.35 MG tablet Take 1 tablet by mouth daily. 06/02/18  Yes [provider]  Multiple Vitamins-Minerals (MULTIVITAMIN ADULTS) TABS Take by mouth.   Yes [provider]    Review of Systems    Lightheadedness, fatigue, generalized malaise, heaviness of her extremities.  Intermittent palpitations and elevated heart rates.  She denies chest pain, dyspnea, syncope, edema, or early satiety.  All other systems reviewed and are otherwise negative except as noted above.  Physical Exam    VS:  BP 106/60 (BP Location: Right Arm, Patient Position: Sitting, Cuff Size: Normal)    Pulse 87    Temp (!) 97.3 F (36.3 C)    Ht 5\' 3"  (1.6 m)    Wt 117 lb 4 oz (53.2 kg)    BMI 20.77 kg/m  , BMI Body mass index is 20.77 kg/m.  Orthostatic VS for the past 24 hrs:  BP- Lying Pulse- Lying BP- Sitting Pulse- Sitting BP- Standing at 0 minutes Pulse- Standing at 0 minutes  07/07/19 1155 111/76 88 131/82 118 117/84 121   GEN: Well nourished, well developed, in no acute distress. HEENT: normal. Neck: Supple, no JVD, carotid bruits, or masses. Cardiac: RRR, no murmurs, rubs, or gallops. No clubbing, cyanosis, edema.  Radials/DP/PT 2+ and equal bilaterally.  Respiratory:  Respirations regular and unlabored, clear to auscultation bilaterally. GI: Soft, nontender, nondistended, BS + x 4. MS: no deformity or atrophy. Skin: warm and dry, no rash. Neuro:  Strength and sensation are intact. Psych: Normal affect.  Accessory Clinical Findings    ECG personally reviewed by me today -regular sinus rhythm, 87 - no acute changes.  Lab Results  Component Value Date   WBC 5.8 06/09/2019   HGB 13.8 06/09/2019   HCT 40.8 06/09/2019   MCV 92 06/09/2019   PLT 196 06/09/2019   Lab Results    Component Value Date   CREATININE 0.70 06/09/2019   BUN 12 06/09/2019   NA 141 06/09/2019   K 3.9 06/09/2019   CL 103 06/09/2019   CO2 22 06/09/2019   Lab Results  Component Value Date   ALT 9 05/21/2019   AST 11 05/21/2019   ALKPHOS 43 05/21/2019   BILITOT 0.8 05/21/2019   Lab Results  Component Value Date   CHOL 150 05/21/2019   HDL 56 05/21/2019   LDLCALC 84 05/21/2019   TRIG 52 05/21/2019     Assessment & Plan    1.  Postural tachycardia/orthostasis with lightheadedness: Patient has a several year history of intermittent palpitations with baseline elevated heart rates and question of inappropriate sinus tachycardia on prior monitoring in 2017.  Over the same period of time, she has suffered from orthostatic lightheadedness.  With the symptoms as a backdrop, she also notes intermittent flares of malaise and  fatigue which can persist months at a time and also resolve for months at a time.  Since August, she has been having fatigue and malaise and notes that her extremities feel heavy.  She has noted some elevation of heart rates though fatigue and malaise persist regardless of heart rate.  She is also had some worsening of baseline orthostatic lightheadedness.  She is in sinus rhythm today at a rate of 87 while lying down for her twelve-lead ECG but upon sitting, rate increases to 118 and then 121 with standing.  Her blood pressure did drop from 131/82 while sitting down to 117/84 immediately upon standing.  She was lightheaded with that drop the follow-up blood pressure at 3 minutes of standing was 122/80.  We discussed the role and importance of hydration, regular activity, liberalization of salt, changing positions more slowly, and use of compression hose to help mitigate effects of orthostatic symptoms.  We also discussed the role of repeat monitoring to reevaluate heart rates and rhythm prior to referral to electrophysiology for evaluation of possible POTS.  We also discussed the  potential medical therapy (?  blocker though she reports low bp's @ home; florinef, midodrine, etc).  We are told that her insurance is not accepted by [facility] and so she would first like to try some nonpharmacologic measures as mentioned above prior to considering further work-up, medical management, or referral to EP.  I think this is very reasonable.  2.  Malaise and fatigue: See #1.  3.  Disposition: Patient will try lifestyle modifications and contact us if symptoms do not improve.  She will check with her insurance to see what cardiology practice would fall in network.   Murray Hodgkins, NP 07/07/2019, 12:45 PM

## 2019-07-09 NOTE — Addendum Note (Signed)
Addended by: Raelene Bott, BRANDY L on: 07/09/2019 10:58 AM   Modules accepted: Orders

## 2019-07-16 ENCOUNTER — Ambulatory Visit (INDEPENDENT_AMBULATORY_CARE_PROVIDER_SITE_OTHER): Payer: BLUE CROSS/BLUE SHIELD

## 2019-07-16 ENCOUNTER — Other Ambulatory Visit: Payer: Self-pay

## 2019-07-16 DIAGNOSIS — Z23 Encounter for immunization: Secondary | ICD-10-CM | POA: Diagnosis not present

## 2019-08-02 DIAGNOSIS — K30 Functional dyspepsia: Secondary | ICD-10-CM | POA: Insufficient documentation

## 2019-08-02 DIAGNOSIS — N83201 Unspecified ovarian cyst, right side: Secondary | ICD-10-CM | POA: Insufficient documentation

## 2019-11-08 LAB — HM PAP SMEAR

## 2019-11-10 ENCOUNTER — Ambulatory Visit: Payer: BLUE CROSS/BLUE SHIELD | Admitting: Family

## 2019-11-11 ENCOUNTER — Other Ambulatory Visit: Payer: Self-pay | Admitting: Neurology

## 2019-11-11 ENCOUNTER — Telehealth: Payer: Self-pay | Admitting: Family

## 2019-11-11 DIAGNOSIS — D352 Benign neoplasm of pituitary gland: Secondary | ICD-10-CM

## 2019-11-11 NOTE — Telephone Encounter (Signed)
Spoke with patient. Last year patients insurance was OON with our office, this year it is back in network. Patient would like to continue being seen here.

## 2019-11-11 NOTE — Telephone Encounter (Signed)
-----   Message from Britt Bottom, [place] sent at 11/11/2019  8:42 AM EST ----- Pt has upcoming appointment with Gilford Rile, NP ON 11/17/2019. Pt has been seen @[facility]  after our last ov.  Please advise if pt will continue care with our office or wants to continue care with [facility].  Thank you, Lenda Kelp

## 2019-11-11 NOTE — Progress Notes (Deleted)
Office Visit    Patient Name: Sherry Park Date of Encounter: 11/11/2019  Primary Care Provider:  Volney American, PA-C Primary Cardiologist:  Kathlyn Sacramento, MD Electrophysiologist:  None   Chief Complaint    Sherry Park is a 34 y.o. female with a hx of palpitations, anxiety, dizziness presents today for chief complaint of orthostatic blood pressures.    Past Medical History    Past Medical History:  Diagnosis Date  . Anxiety   . Headache    a. felt to be 2/2 Dostinex  . Hemorrhoids   . Hyperprolactinemia ([place])   . Microprolactinoma ([place])    a. on Dostinex; b. followed by Dr. Gabriel Carina, MD  . Palpitations    a. 04/2016 Event Monitor: 1 episode of narrow complex tachycardia @ 163 - likely sinus tach though SVT could not be excluded (happened during "anxiety and panic attack"); b. 04/2016 Echo: EF 60-65%, no rwma. Nl LA size. Nl RV fxn.  Marland Kitchen PCOS (polycystic ovarian syndrome)    Past Surgical History:  Procedure Laterality Date  . ESOPHAGOGASTRODUODENOSCOPY (EGD) WITH PROPOFOL N/A 09/24/2018   Procedure: ESOPHAGOGASTRODUODENOSCOPY (EGD) WITH PROPOFOL;  Surgeon: Lin Landsman, MD;  Location: [facility];  Service: Gastroenterology;  Laterality: N/A;  . NO PAST SURGERIES     verified with patient- 09/09/17    Allergies  Allergies  Allergen Reactions  . Doxycycline Other (See Comments)    Thrush and rash  . Latex Itching  . Pantoprazole Other (See Comments)    abdominal pain    History of Present Illness    Sherry Park is a 34 y.o. female with a hx of palpitations, anxiety. She was last seen by Ignacia Bayley, NP on 07/07/19.  She has a pituitary adenoma (prolactinoma) initially diagnosed in 2012 for which she takes Dostinex and follows with Dr. Gabriel Carina of endocrinology.   July 2017 she had an echo with normal LVEF and event monitoring with one episode of narrow complex tachycardia felt to be sinus tachycardia. Event occurred during report of  "anxiety and panic attack". In 2017 her palpitation were felt to be possibly secondary to inappropriate sinus tachycardia worsened by underlying stress/anxiety. Beta blocker was offered and politely declined.   When seen September 2020 noted intermittent "flares" of lightheadedness, weakness, fatigue that last weeks to months and self resolve. She was noted to have orthostatic hypotension. There was discussion regarding he possibility of repeat monitoring to reevaluate heart rates, possible referral to EP for evaluation of possible POTS, and medical therapies (beta blocker, florinef, midodrine, etc). Her insurance was not accepted by [facility] at the time and she opted to start with nonpharmacologic measures.  Seen by [facility] cardiology 07/2019 with concern for postprandial hypotension. She was recommended to follow with neurology. Seen by [facility] neurology who did not recommend automonic testing. Seen by [facility] neurology Dr. Manuella Ghazi 11/08/19 with notation of generalized weakness. Recommended for nerve conduction studies and multiple labs. She additionally follows with gastroenterology.   Labs 11/10/19:   Hb 13.5, overall normal CBC  AST 15, ALT 15, K 4.1, creatinine 0.6, GFR 117, Ca 9.8  A1c 5.0  Folate >22.3, sed rate 5, B12 459, A/G ration 1.6,  TSH 1.085, Free T4 0.92  ***  EKGs/Labs/Other Studies Reviewed:   The following studies were reviewed today: ***  EKG:  EKG is ordered today.  The ekg ordered today demonstrates ***  Recent Labs: 05/21/2019: ALT 9; TSH 1.560 06/09/2019: BUN 12; Creatinine, Ser 0.70; Hemoglobin  13.8; Platelets 196; Potassium 3.9; Sodium 141  Recent Lipid Panel    Component Value Date/Time   CHOL 150 05/21/2019 0939   TRIG 52 05/21/2019 0939   HDL 56 05/21/2019 0939   LDLCALC 84 05/21/2019 0939    Home Medications   No outpatient medications have been marked as taking for the 11/17/19 encounter (Appointment) with Loel Dubonnet, NP.      Review of Systems     ***   ROS All other systems reviewed and are otherwise negative except as noted above.  Physical Exam    VS:  There were no vitals taken for this visit. , BMI There is no height or weight on file to calculate BMI. GEN: Well nourished, well developed, in no acute distress. HEENT: normal. Neck: Supple, no JVD, carotid bruits, or masses. Cardiac: ***RRR, no murmurs, rubs, or gallops. No clubbing, cyanosis, edema.  ***Radials/DP/PT 2+ and equal bilaterally.  Respiratory:  ***Respirations regular and unlabored, clear to auscultation bilaterally. GI: Soft, nontender, nondistended, BS + x 4. MS: No deformity or atrophy. Skin: Warm and dry, no rash. Neuro:  Strength and sensation are intact. Psych: Normal affect.  Accessory Clinical Findings    ECG personally reviewed by me today - *** - no acute changes.  Assessment & Plan    1. ***  Disposition: Follow up {follow up:15908} with ***   Loel Dubonnet, NP 11/11/2019, 2:16 PM

## 2019-11-11 NOTE — Telephone Encounter (Signed)
Ok Thank you for verifying.

## 2019-11-16 ENCOUNTER — Telehealth: Payer: Self-pay | Admitting: Family

## 2019-11-17 ENCOUNTER — Ambulatory Visit: Payer: Self-pay | Admitting: Family

## 2019-11-21 ENCOUNTER — Ambulatory Visit: Payer: Self-pay

## 2019-11-21 NOTE — Progress Notes (Signed)
Virtual Visit via Video Note   This visit type was conducted due to national recommendations for restrictions regarding the COVID-19 Pandemic (e.g. social distancing) in an effort to limit this patient's exposure and mitigate transmission in our community.  Due to her co-morbid illnesses, this patient is at least at moderate risk for complications without adequate follow up.  This format is felt to be most appropriate for this patient at this time.  All issues noted in this document were discussed and addressed.  A limited physical exam was performed with this format.  Please refer to the patient's chart for her consent to telehealth for [facility].   Date:  11/22/2019   ID:  Sherry Park, DOB September 20, 1986, MRN [id number]  Patient Location: Home Provider Location: Office  PCP:  Volney American, PA-C  Cardiologist:  Kathlyn Sacramento, MD  Electrophysiologist:  None   Evaluation Performed:  Follow-Up Visit  Chief Complaint:  palpitations  History of Present Illness:    Sherry Park is a 34 y.o. female with hx of palpitations, anxiety, PCOS.  Previously evaluated July 2017 with echo showing normal LVEF and event monitoring with 1 episode of narrow complex tachycardia most likely ST. Event occurred during reported "anxiety and panic attack". She was offered beta blocker, but was able to control symptoms without intervention.  Last seen 07/07/2019 by Ignacia Bayley, NP. At that time reported "flares" of lightheadedness, weakness, fatigue lasting weeks to months that self resolve. She had orthostatic vitals at that visit which wer positive.   She was seen 08/02/19 by [facility] cardiology due to insurance changes. She was recommended abdominal compression, small frequent meals. Many of her symptoms were felt to be post-prandial. Has been diagnosed with functional dyspepsia.   Follows with gastroenterology for constipation, dyspepsia. When last seen 10/26/19 was recommended for breath test  for SIBO. Follows with Dr. Manuella Ghazi of neurology with recommendation 11/08/19 for repeat MRI for monitoring of pituitary adenoma and NCS upper and lower extremity for neuropathic symptoms and weakness. She had comprehensive labs done.   Labs via Care Everywhere 11/2019  Vitamin D 21.6, folate >22.3, vitamin B12 459  Hemoglobin 13.5  AST 15, ALT 15, NA 140, K4.1, creatinine 0.6, GFR 117  A1c 5  TSH 1.085, free T4 0.92  Have metal screen negative  Reports continued palpitations and rapid heart rates.  Reports it is worse around her menstrual cycle. Baseline fast heart beat. HR normally above 100bpm. Sitting 80-90 bpm when monitored on apple watch.   Tells me she will get a sensation of being lightheaded and as if she is "on a boat ".  She has upcoming MRI Wednesday for reevaluation of her pituitary tumor.  No chest pain, pressure, tightness.  Does report some intermittent pain in her left cheek.  But no neck or arm pain.  Reports no shortness of breath nor DOE.  Feels she gets dehydrated very easily.  Tells me she feels worse on a high sodium diet.  Drinks 2 L of water every day.  Tells me she has been trying to add more potassium and her diet and this makes her feel better.  We discussed increasing her dietary magnesium intake as well to assist her bodies absorption of potassium.  Endorses continued fatigue.  Tells me she was started on prescription vitamin D last week.  The patient does not have symptoms concerning for COVID-19 infection (fever, chills, cough, or new shortness of breath).    Past Medical History:  Diagnosis  Date  . Anxiety   . Headache    a. felt to be 2/2 Dostinex  . Hemorrhoids   . Hyperprolactinemia ([place])   . Microprolactinoma ([place])    a. on Dostinex; b. followed by Dr. Gabriel Carina, MD  . Palpitations    a. 04/2016 Event Monitor: 1 episode of narrow complex tachycardia @ 163 - likely sinus tach though SVT could not be excluded (happened during "anxiety and panic attack");  b. 04/2016 Echo: EF 60-65%, no rwma. Nl LA size. Nl RV fxn.  Marland Kitchen PCOS (polycystic ovarian syndrome)    Past Surgical History:  Procedure Laterality Date  . ESOPHAGOGASTRODUODENOSCOPY (EGD) WITH PROPOFOL N/A 09/24/2018   Procedure: ESOPHAGOGASTRODUODENOSCOPY (EGD) WITH PROPOFOL;  Surgeon: Lin Landsman, MD;  Location: [facility];  Service: Gastroenterology;  Laterality: N/A;  . NO PAST SURGERIES     verified with patient- 09/09/17     Current Meds  Medication Sig  . cabergoline (DOSTINEX) 0.5 MG tablet Take 0.25 mg by mouth 2 (two) times a week.   . ergocalciferol (VITAMIN D2) 1.25 MG (50000 UT) capsule Take by mouth.  Shaune Pascal 0.35 MG tablet Take 1 tablet by mouth daily.  . Multiple Vitamins-Minerals (MULTIVITAMIN ADULTS) TABS Take by mouth.     Allergies:   Doxycycline, Latex, and Pantoprazole   Social History   Tobacco Use  . Smoking status: Never Smoker  . Smokeless tobacco: Never Used  Substance Use Topics  . Alcohol use: No  . Drug use: No     Family Hx: The patient's family history includes Cancer in her paternal grandfather; Congestive Heart Failure in her maternal grandmother; Diabetes Mellitus II in her mother; Hypertension in her mother and sister; Kidney disease in her father and maternal grandmother; Stroke in her mother. There is no history of Hematuria, Kidney cancer, Prostate cancer, Sickle cell trait, or Tuberculosis.  ROS:   Please see the history of present illness.    Review of Systems  Constitution: Positive for malaise/fatigue. Negative for chills and fever.  Cardiovascular: Positive for palpitations. Negative for chest pain, dyspnea on exertion, leg swelling, near-syncope, orthopnea and syncope.  Respiratory: Negative for cough, shortness of breath and wheezing.   Gastrointestinal: Negative for nausea and vomiting.  Neurological: Positive for dizziness and light-headedness. Negative for weakness.   All other systems reviewed and are  negative.  Prior CV studies:   The following studies were reviewed today:  Echo 2017 Left ventricle: The cavity size was normal. Systolic function was    normal. The estimated ejection fraction was in the range of 60%    to 65%. Wall motion was normal; there were no regional wall    motion abnormalities. Left ventricular diastolic function    parameters were normal.  - Left atrium: The atrium was normal in size.  - Right ventricle: Systolic function was normal.  - Pulmonary arteries: Systolic pressure was within the normal    range.   Holter Monitor 2017 Normal sinus rhythm with occasional PACs. One episode of narrow complex tachycardia at a heart rate 163 bpm likely sinus tachycardia. SVT cannot be completely excluded. Symptoms during this episode included "anxiety and a panic attack".   Labs/Other Tests and Data Reviewed:    EKG:  No ECG reviewed.  Recent Labs: 05/21/2019: ALT 9; TSH 1.560 06/09/2019: BUN 12; Creatinine, Ser 0.70; Hemoglobin 13.8; Platelets 196; Potassium 3.9; Sodium 141   Recent Lipid Panel Lab Results  Component Value Date/Time   CHOL 150 05/21/2019 09:39 AM  TRIG 52 05/21/2019 09:39 AM   HDL 56 05/21/2019 09:39 AM   LDLCALC 84 05/21/2019 09:39 AM    Wt Readings from Last 3 Encounters:  11/22/19 119 lb (54 kg)  07/07/19 117 lb 4 oz (53.2 kg)  06/09/19 116 lb (52.6 kg)     Objective:    Vital Signs:  BP 130/70   Pulse (!) 115   Ht 5\' 3"  (1.6 m)   Wt 119 lb (54 kg)   BMI 21.08 kg/m    VITAL SIGNS:  reviewed GEN:  no acute distress RESPIRATORY:  normal respiratory effort, symmetric expansion NEURO:  alert and oriented x 3, no obvious focal deficit PSYCH:  normal affect  ASSESSMENT & PLAN:    1. SVT/Palpitations - Several year hx of intermittent palpitations with baseline elevated HR and question of inappropriate ST on monitoring in 2017. Also noted orthostatic lightheadedness. Previous concern for dx of POTS but not improved with high-salt  diet and increased PO fluid intake. Per [facility] cardiology notes, symptoms were often postprandial. She continues to have episodes of rapid heart rates and palpitations. Resting HR 80-90 and with activity >100. No reported bradycardia. Symptoms worst one week prior to menstrual cycle. Recent lab work with normal K, TSH, T4, Hb. No excessive caffeine use, no tobacco, no etoh, no OTC nor prescribed pro-arrthymic medications noted. Reports symptoms are better with increasing her oral K intake.  Plan for 14 day ZIO monitor to be mailed to her home. She will wear when her symptoms are typically worst.   Discussed possible echo (last 2017 was normal) and she prefers to wait for result of ZIO monitor.  Increase dietary sources of magnesium as will assist with absorption of potassium. List of high magnesium foods provided to patient. Recent K 4.1.  Pending result of ZIO, consider referral to EP.  We discussed the possibility of medical therapies (beta blocker, florinef, midodrine) but will await results of ZIO. Tells me her BP has not been dropping as badly since drinking 2L fluids daily.  2. Fatigue - Etiology unknown. Recently started on vitamin D supplementation. Labs this month with no evidence of anemia, thyroid abnormality. Consider tachycardia or palpitations as possible etiology. Plan for ZIO, as above.  3. Lightheadedness/Dizziness - Upcoming MRI for re-evaluation of pituitary adenoma. ZIO monitor to rule out arrhthymias as origin. Encouraged continued hydration and careful position changes. Etiology arrhythmia vs orthostatic hypotension vs POTS vs noncardiac.  4. Vitamin D deficiency - Started on prescription supplement by her neurology for vitamin D level 21.6 recently.  Likely contributory to her fatigue.  5. Pituitary adnemoa- Known history.  Upcoming MRI for reevaluation in the setting of dizziness. Follows with Dr. Nilda Simmer of endocrinology.  COVID-19 Education: The signs and symptoms of  COVID-19 were discussed with the patient and how to seek care for testing (follow up with PCP or arrange E-visit).  The importance of social distancing was discussed today.  Time:   Today, I have spent 17 minutes with the patient with telehealth technology discussing the above problems.     Medication Adjustments/Labs and Tests Ordered: Current medicines are reviewed at length with the patient today.  Concerns regarding medicines are outlined above.   Tests Ordered: Orders Placed This Encounter  Procedures  . LONG TERM MONITOR (3-14 DAYS)    Medication Changes: No orders of the defined types were placed in this encounter.   Follow Up: 14 day ZIO. Follow up  In Person in 6 week(s) with Dr. Fletcher Anon or  APP.  Signed, Loel Dubonnet, NP  11/22/2019 10:18 AM    [place]

## 2019-11-22 ENCOUNTER — Other Ambulatory Visit: Payer: Self-pay

## 2019-11-22 ENCOUNTER — Telehealth (INDEPENDENT_AMBULATORY_CARE_PROVIDER_SITE_OTHER): Payer: 59 | Admitting: Family

## 2019-11-22 ENCOUNTER — Encounter: Payer: Self-pay | Admitting: Family

## 2019-11-22 VITALS — BP 130/70 | HR 115 | Ht 63.0 in | Wt 119.0 lb

## 2019-11-22 DIAGNOSIS — I471 Supraventricular tachycardia: Secondary | ICD-10-CM | POA: Diagnosis not present

## 2019-11-22 DIAGNOSIS — R5383 Other fatigue: Secondary | ICD-10-CM | POA: Diagnosis not present

## 2019-11-22 DIAGNOSIS — R002 Palpitations: Secondary | ICD-10-CM

## 2019-11-22 DIAGNOSIS — E559 Vitamin D deficiency, unspecified: Secondary | ICD-10-CM | POA: Diagnosis not present

## 2019-11-22 NOTE — Patient Instructions (Signed)
Medication Instructions:  No medication changes.  *If you need a refill on your cardiac medications before your next appointment, please call your pharmacy*  Lab Work: No lab work today. Lab work from [employer] this month looks good! Electrolytes, thyroid, kidney/liver/thyroid function were all normal. CBC was normal with no evidence of anemia.  Testing/Procedures:  Your physician has recommended that you wear a Zio monitor. Wear your monitor for 14 days. Please wear it during the time when your symptoms are worst. (For example, 2 weeks prior to your menstrual cycle or the week prior and week of your menstrual cycle). Please wait to wear until after your upcoming MRI.   This monitor is a medical device that records the heart's electrical activity. Doctors most often use these monitors to diagnose arrhythmias. Arrhythmias are problems with the speed or rhythm of the heartbeat. The monitor is a small device applied to your chest. You can wear one while you do your normal daily activities. While wearing this monitor if you have any symptoms to push the button and record what you felt. Once you have worn this monitor for the period of time provider prescribed, you will return the monitor device in the postage paid box. Once it is returned they will download the data collected and provide [place] with a report which the provider will then review and we will call you with those results. Important tips:  1. Avoid showering during the first 24 hours of wearing the monitor. 2. Avoid excessive sweating to help maximize wear time. 3. Do not submerge the device, no hot tubs, and no swimming pools. 4. Keep any lotions or oils away from the patch. 5. After 24 hours you may shower with the patch on. Take brief showers with your back facing the shower head.  6. Do not remove patch once it has been placed because that will interrupt data and decrease adhesive wear time. 7. Push the button when you have any symptoms and  write down what you were feeling. 8. Once you have completed wearing your monitor, remove and place into box which has postage paid and place in your outgoing mailbox.  9. If for some reason you have misplaced your box then call our office and we can provide another box and/or mail it off for you.        Follow-Up: At [facility], you and your health needs are our priority.  As part of our continuing mission to provide you with exceptional heart care, we have created designated Provider Care Teams.  These Care Teams include your primary Cardiologist (physician) and Advanced Practice Providers (APPs -  Physician Assistants and Nurse Practitioners) who all work together to provide you with the care you need, when you need it.  Your next appointment:   6 week(s)  The format for your next appointment:   In Person  Provider:    You may see Kathlyn Sacramento, MD or one of the following Advanced Practice Providers on your designated Care Team:    Murray Hodgkins, NP  Christell Faith, PA-C  Marrianne Mood, PA-C  Other Instructions   Keep up the good work adding potassium and fluids to your diet! Continue to avoid caffeinate. Recommend increasing magnesium.   Magnesium rich foods:   Make sure that your diet includes foods with magnesium. Foods that have a lot of magnesium in them include: ? Green leafy vegetables, such as spinach and broccoli. ? Beans and peas. ? Nuts and seeds, such as almonds and  sunflower seeds. ? Whole grains, such as whole grain bread and fortified cereals.  When you are active, drink fluids that contain electrolytes.  Avoid drinking alcohol.  [email or web address]

## 2019-11-24 ENCOUNTER — Other Ambulatory Visit: Payer: Self-pay

## 2019-11-24 ENCOUNTER — Ambulatory Visit
Admission: RE | Admit: 2019-11-24 | Discharge: 2019-11-24 | Disposition: A | Discharge status: Home / Self Care | Payer: 59 | Source: Ambulatory Visit | Attending: Neurology | Admitting: Neurology

## 2019-11-24 DIAGNOSIS — D352 Benign neoplasm of pituitary gland: Secondary | ICD-10-CM | POA: Insufficient documentation

## 2019-11-24 MED ORDER — GADOBUTROL 1 MMOL/ML IV SOLN
5.0000 mL | Freq: Once | INTRAVENOUS | Status: AC | PRN
  Administered 2019-11-24: 10:00:00 5 mL via INTRAVENOUS

## 2019-12-01 ENCOUNTER — Ambulatory Visit (INDEPENDENT_AMBULATORY_CARE_PROVIDER_SITE_OTHER): Payer: 59

## 2019-12-01 DIAGNOSIS — I471 Supraventricular tachycardia: Secondary | ICD-10-CM | POA: Diagnosis not present

## 2019-12-01 DIAGNOSIS — R002 Palpitations: Secondary | ICD-10-CM

## 2019-12-24 ENCOUNTER — Telehealth: Payer: Self-pay

## 2019-12-24 NOTE — Telephone Encounter (Signed)
Spoke with patient regarding results.  Patient verbalizes understanding. Advised patient to call back with any issues or concerns.  

## 2019-12-24 NOTE — Telephone Encounter (Signed)
-----   Message from Loel Dubonnet, NP sent at 12/24/2019  2:22 PM EDT ----- ZIO monitor showed predominantly normal sinus rhythm. This is a good result! She had rare (<1%) early beats in the top chamber of her heart. These are not dangerous and often feel like a "skipping". When she pressed the button, she was in sinus tachycardia which is a normal fast heart rate. Her symptoms of rapid heart rates are likely awareness of her heart.

## 2020-01-14 ENCOUNTER — Other Ambulatory Visit: Payer: Self-pay

## 2020-01-14 ENCOUNTER — Ambulatory Visit: Payer: 59 | Attending: Internal Medicine

## 2020-01-14 DIAGNOSIS — Z23 Encounter for immunization: Secondary | ICD-10-CM

## 2020-01-14 NOTE — Progress Notes (Signed)
   Covid-19 Vaccination Clinic  Name:  Sherry Park    MRN: [id number] DOB: June 03, 1986  01/14/2020  Ms. Sherry Park was observed post Covid-19 immunization for 15 minutes without incident. She was provided with Vaccine Information Sheet and instruction to access the V-Safe system.   Ms. Sherry Park was instructed to call 911 with any severe reactions post vaccine: Marland Kitchen Difficulty breathing  . Swelling of face and throat  . A fast heartbeat  . A bad rash all over body  . Dizziness and weakness   Immunizations Administered    Name Date Dose VIS Date Route   Pfizer COVID-19 Vaccine 01/14/2020  8:15 AM 0.3 mL 09/17/2019 Intramuscular   Manufacturer: [place]   Lot: [id number]   [place]: [id number]

## 2020-01-22 ENCOUNTER — Encounter: Payer: Self-pay | Admitting: Family Medicine

## 2020-01-26 ENCOUNTER — Other Ambulatory Visit: Payer: Self-pay

## 2020-01-26 ENCOUNTER — Ambulatory Visit (INDEPENDENT_AMBULATORY_CARE_PROVIDER_SITE_OTHER): Payer: 59 | Admitting: Family Medicine

## 2020-01-26 ENCOUNTER — Encounter: Payer: Self-pay | Admitting: Family Medicine

## 2020-01-26 VITALS — BP 118/80 | HR 118 | Temp 98.6°F | Wt 121.0 lb

## 2020-01-26 DIAGNOSIS — R21 Rash and other nonspecific skin eruption: Secondary | ICD-10-CM | POA: Diagnosis not present

## 2020-01-26 NOTE — Progress Notes (Signed)
BP 118/80   Pulse (!) 118   Temp 98.6 F (37 C) (Oral)   Wt 121 lb (54.9 kg)   SpO2 98%   BMI 21.43 kg/m    Subjective:    Patient ID: Sherry Park, female    DOB: 1986-04-14, 34 y.o.   MRN: [id number]  HPI: SHAVAWN GAUTNEY is a 34 y.o. female  Chief Complaint  Patient presents with  . Arm Pain    left arm painfull and redness at the injection site after pt got the covid 19 vaccine on 01/14/20   Presenting today for left upper arm redness and tenderness at the site of her recent [place] vaccine about 11 days ago. No other associated sxs, lymphadenopathy, streaking, fevers, chills. Not trying anything OTC for sxs. Pt due for second shot very soon and concerned about getting it now.   Relevant past medical, surgical, family and social history reviewed and updated as indicated. Interim medical history since our last visit reviewed. Allergies and medications reviewed and updated.  Review of Systems  Per HPI unless specifically indicated above     Objective:    BP 118/80   Pulse (!) 118   Temp 98.6 F (37 C) (Oral)   Wt 121 lb (54.9 kg)   SpO2 98%   BMI 21.43 kg/m   Wt Readings from Last 3 Encounters:  01/26/20 121 lb (54.9 kg)  11/22/19 119 lb (54 kg)  07/07/19 117 lb 4 oz (53.2 kg)    Physical Exam Vitals and nursing note reviewed.  Constitutional:      Appearance: Normal appearance. She is not ill-appearing.  HENT:     Head: Atraumatic.  Eyes:     Extraocular Movements: Extraocular movements intact.     Conjunctiva/sclera: Conjunctivae normal.  Cardiovascular:     Rate and Rhythm: Normal rate and regular rhythm.     Heart sounds: Normal heart sounds.  Pulmonary:     Effort: Pulmonary effort is normal.     Breath sounds: Normal breath sounds.  Musculoskeletal:        General: Normal range of motion.     Cervical back: Normal range of motion and neck supple.  Skin:    General: Skin is warm and dry.     Findings: Erythema (Erythematous macular  area left deltoid at injection site, no significant edema, ttp) present.  Neurological:     Mental Status: She is alert and oriented to person, place, and time.  Psychiatric:        Mood and Affect: Mood normal.        Thought Content: Thought content normal.        Judgment: Judgment normal.     Results for orders placed or performed in visit on 06/09/19  Ferritin  Result Value Ref Range   Ferritin 56 15 - 150 ng/mL  Iron Binding Cap (TIBC)([facility])  Result Value Ref Range   Total Iron Binding Capacity 336 250 - 450 ug/dL   UIBC 165 131 - 425 ug/dL   Iron 171 (H) 27 - 159 ug/dL   Iron Saturation 51 15 - 55 %  Vitamin B12  Result Value Ref Range   Vitamin B-12 490 232 - 1,245 pg/mL  Folate  Result Value Ref Range   Folate >20.0 >3.0 ng/mL  CBC with Differential/Platelet  Result Value Ref Range   WBC 5.8 3.4 - 10.8 x10E3/uL   RBC 4.45 3.77 - 5.28 x10E6/uL   Hemoglobin 13.8 11.1 - 15.9 g/dL  Hematocrit 40.8 34.0 - 46.6 %   MCV 92 79 - 97 fL   MCH 31.0 26.6 - 33.0 pg   MCHC 33.8 31.5 - 35.7 g/dL   RDW 11.8 11.7 - 15.4 %   Platelets 196 150 - 450 x10E3/uL   Neutrophils 65 Not Estab. %   Lymphs 26 Not Estab. %   Monocytes 7 Not Estab. %   Eos 1 Not Estab. %   Basos 1 Not Estab. %   Neutrophils Absolute 3.8 1.4 - 7.0 x10E3/uL   Lymphocytes Absolute 1.5 0.7 - 3.1 x10E3/uL   Monocytes Absolute 0.4 0.1 - 0.9 x10E3/uL   EOS (ABSOLUTE) 0.1 0.0 - 0.4 x10E3/uL   Basophils Absolute 0.1 0.0 - 0.2 x10E3/uL   Immature Granulocytes 0 Not Estab. %   Immature Grans (Abs) 0.0 0.0 - 0.1 [id number]  Basic metabolic panel  Result Value Ref Range   Glucose 91 65 - 99 mg/dL   BUN 12 6 - 20 mg/dL   Creatinine, Ser 0.70 0.57 - 1.00 mg/dL   GFR calc non Af Amer 116 >59 mL/min/1.73   GFR calc Af Amer 134 >59 mL/min/1.73   BUN/Creatinine Ratio 17 9 - 23   Sodium 141 134 - 144 mmol/L   Potassium 3.9 3.5 - 5.2 mmol/L   Chloride 103 96 - 106 mmol/L   CO2 22 20 - 29 mmol/L   Calcium  9.7 8.7 - 10.2 mg/dL      Assessment & Plan:   Problem List Items Addressed This Visit    None    Visit Diagnoses    Rash    -  Primary   Localized topical reaction to COVID vaccine. No evidence of significant allergy to vaccine. Ice, antihistamines, monitor. Return precautions given       Follow up plan: Return if symptoms worsen or fail to improve.

## 2020-02-09 ENCOUNTER — Ambulatory Visit: Payer: 59 | Attending: Internal Medicine

## 2020-02-09 ENCOUNTER — Other Ambulatory Visit: Payer: Self-pay

## 2020-02-09 DIAGNOSIS — Z23 Encounter for immunization: Secondary | ICD-10-CM

## 2020-02-09 NOTE — Progress Notes (Signed)
   Covid-19 Vaccination Clinic  Name:  Sherry Park    MRN: [id number] DOB: [birth year]  02/09/2020  [name] was observed post Covid-19 immunization for 15 minutes without incident. She was provided with Vaccine Information Sheet and instruction to access the V-Safe system.   [name] was instructed to call 911 with any severe reactions post vaccine: Marland Kitchen Difficulty breathing  . Swelling of face and throat  . A fast heartbeat  . A bad rash all over body  . Dizziness and weakness   Immunizations Administered    Name Date Dose VIS Date Route   Pfizer COVID-19 Vaccine 02/09/2020  8:24 AM 0.3 mL 12/01/2018 Intramuscular   Manufacturer: [place]   Lot: [id number]   [place]: [id number]

## 2020-02-12 ENCOUNTER — Ambulatory Visit: Payer: 59

## 2020-03-09 ENCOUNTER — Ambulatory Visit (INDEPENDENT_AMBULATORY_CARE_PROVIDER_SITE_OTHER): Payer: 59 | Admitting: Gastroenterology

## 2020-03-09 ENCOUNTER — Other Ambulatory Visit: Payer: Self-pay

## 2020-03-09 ENCOUNTER — Encounter: Payer: Self-pay | Admitting: Gastroenterology

## 2020-03-09 VITALS — BP 131/87 | HR 90 | Temp 98.5°F | Ht 63.0 in | Wt 119.0 lb

## 2020-03-09 DIAGNOSIS — K30 Functional dyspepsia: Secondary | ICD-10-CM

## 2020-03-09 MED ORDER — DESIPRAMINE HCL 25 MG PO TABS: 25.0000 mg | ORAL_TABLET | Freq: Every day | ORAL | 0 refills | Status: DC

## 2020-03-09 MED ORDER — ONDANSETRON HCL 4 MG PO TABS: 4.0000 mg | ORAL_TABLET | Freq: Three times a day (TID) | ORAL | 1 refills | Status: DC | PRN

## 2020-03-09 NOTE — Progress Notes (Signed)
Cephas Darby, MD [address]  [place]  [place], [place] [address]  Main: [phone]  Fax: [phone]    Gastroenterology Consultation  Referring Provider:     Volney American,* Primary Care Physician:  Volney American, PA-C Primary Gastroenterologist:  Dr. Cephas Darby Reason for Consultation:   Functional dyspepsia        HPI:   Sherry Park is a 34 y.o. female referred by Dr. Orene Desanctis, Lilia Argue, PA-C  for consultation & management of chronic dyspepsia.  Patient has been experiencing nausea, early satiety, bloating, burping since July, got worse in August.  Due to persistent symptoms, she was empirically treated for H. pylori infection with triple therapy based on positive serologies.  Her symptoms improved but continues to have early satiety associated with some bloating and burping. She just started taking probiotics which seems to help with bloating.  She denies weight loss, diarrhea.  She denies any other GI symptoms.  Currently, not on PPI  Follow-up visit 09/15/2018 Her symptoms are persistent and would like to get an EGD.  She noticed that eating carbs particularly are making her symptoms worse.  She continues to lose weight.  She lost about 16 pounds since 05/2018.  She tried FD guard which provided some symptomatic relief.  She continues to report fatigue.  Her H. pylori breath test was negative, B12 normal,  serum TTG IgA negative.  Unknown total IgA levels.  Vitamin D levels normal.  She does not report any anxiety, stress or depression  Follow-up visit 11/17/2018 Patient underwent EGD with duodenal and gastric biopsies which were negative.  Food allergy testing also came back unremarkable.  She saw Dr. Perrin Maltese to evaluate for gallbladder etiology, he felt her symptoms could be related to gallbladder dyskinesia or functional GI disorder and recommended HIDA scan.  However, patient decided to wait for the HIDA scan.  Today, she reports feeling  better, appears to be in good spirits, felt happy as she gained about 4 pounds since last visit.  She brought a list of foods that she noticed triggering her symptoms which were sugary substances, eggs, milk, apple sauce, grapes, watermelon, sweetened tea.  Prior to all the symptoms, she was drinking a lot of sodas.  She would like to know if she has any fructose or sucrose intolerance.  Follow-up virtual visit 04/30/2019 Sherry Park has been seeing me for management of functional dyspepsia.  She reports her symptoms are more or less the same.  She was seen by Dr. Owens Shark to evaluate for any gallbladder etiology.  She deferred HIDA scan at this time.  She has tried low FODMAP diet, and found that some of the foods that are listed in the low FODMAPs for example potatoes and wheat which she is not tolerating.  Her primary symptom is significant abdominal bloating.  She wanted to get fructose and sucrose breath test and confirmed appointment at [facility] on 05/14/2019.  We have tried align, Culturelle.  Align provided mild relief.  Culturelle was not bloating.  She did not find any benefit from rifaximin.  She tried FD guard which did not seem to help.  Currently on low FODMAPs diet.  Her weight is stable  Follow-up visit 03/09/2020 Patient has not seen me for 1 year. With change in her insurance, she saw [facility], Dr. Chales Abrahams on 10/26/2019 as a televisit.  She underwent hydrogen breath test which was negative.  She was suggested about gastric emptying study.  She did not undergo fructose or sucrose intolerance test.  Patient reports that her postprandial bloating as well as nausea is persistent, resulting in decreased p.o. intake.  Reports having regular bowel movements.  Her weight is overall stable.  Food allergy profile and alpha gal panel were negative.  Patient was also evaluated by cardiology for chest pain and palpitations, cardiac work-up negative  NSAIDs: None  Antiplts/Anticoagulants/Anti thrombotics:  None  GI Procedures:  EGD 09/24/2018 - Normal duodenal bulb and second portion of the duodenum. Biopsied. - A few gastric polyps. - Normal stomach. Biopsied. - Normal gastroesophageal junction and esophagus. DIAGNOSIS:  A. DUODENUM; COLD BIOPSY:  - ENTERIC MUCOSA WITH PRESERVED VILLOUS ARCHITECTURE AND NO SIGNIFICANT  HISTOPATHOLOGIC CHANGE.  - NEGATIVE FOR FEATURES OF CELIAC, DYSPLASIA, AND MALIGNANCY.   B. STOMACH, RANDOM; COLD BIOPSY:  - OXYNTIC MUCOSA WITH MINIMAL CHRONIC GASTRITIS AND OXYNTIC GLAND  HYPERPLASIA, SEE NOTE.  - NEGATIVE FOR H. PYLORI, DYSPLASIA AND MALIGNANCY.  She denies family history of GI malignancy She does not smoke or drink alcohol Denies any GI surgeries  Past Medical History:  Diagnosis Date  . Anxiety   . Headache    a. felt to be 2/2 Dostinex  . Hemorrhoids   . Hyperprolactinemia ([place])   . Microprolactinoma ([place])    a. on Dostinex; b. followed by Dr. Gabriel Carina, MD  . Palpitations    a. 04/2016 Event Monitor: 1 episode of narrow complex tachycardia @ 163 - likely sinus tach though SVT could not be excluded (happened during "anxiety and panic attack"); b. 04/2016 Echo: EF 60-65%, no rwma. Nl LA size. Nl RV fxn.  Marland Kitchen PCOS (polycystic ovarian syndrome)     Past Surgical History:  Procedure Laterality Date  . ESOPHAGOGASTRODUODENOSCOPY (EGD) WITH PROPOFOL N/A 09/24/2018   Procedure: ESOPHAGOGASTRODUODENOSCOPY (EGD) WITH PROPOFOL;  Surgeon: Lin Landsman, MD;  Location: [place];  Service: Gastroenterology;  Laterality: N/A;  . NO PAST SURGERIES     verified with patient- 09/09/17    Current Outpatient Medications:  .  cabergoline (DOSTINEX) 0.5 MG tablet, Take 0.25 mg by mouth 2 (two) times a week. , Disp: , Rfl:  .  ergocalciferol (VITAMIN D2) 1.25 MG (50000 UT) capsule, Take by mouth., Disp: , Rfl:  .  ERRIN 0.35 MG tablet, Take 1 tablet by mouth daily., Disp: , Rfl: 3 .  Multiple Vitamins-Minerals (MULTIVITAMIN ADULTS) TABS, Take by  mouth., Disp: , Rfl:  .  desipramine (NORPRAMIN) 25 MG tablet, Take 1 tablet (25 mg total) by mouth daily., Disp: 30 tablet, Rfl: 0 .  ondansetron (ZOFRAN) 4 MG tablet, Take 1 tablet (4 mg total) by mouth every 8 (eight) hours as needed for nausea or vomiting., Disp: 30 tablet, Rfl: 1   Family History  Problem Relation Age of Onset  . Diabetes Mellitus II Mother   . Hypertension Mother   . Stroke Mother        disabling stroke from oral contraceptive pill use at 32 years of age, no family hx of CVA  . Kidney disease Father   . Hypertension Sister   . Congestive Heart Failure Maternal Grandmother   . Kidney disease Maternal Grandmother   . Cancer Paternal Grandfather        skin  . Hematuria Neg Hx   . Kidney cancer Neg Hx   . Prostate cancer Neg Hx   . Sickle cell trait Neg Hx   . Tuberculosis Neg Hx      Social History   Tobacco  Use  . Smoking status: Never Smoker  . Smokeless tobacco: Never Used  Substance Use Topics  . Alcohol use: No  . Drug use: No    Allergies as of 03/09/2020 - Review Complete 03/09/2020  Allergen Reaction Noted  . Doxycycline Other (See Comments) 06/03/2017  . Latex Itching 07/01/2016  . Pantoprazole Other (See Comments) 07/10/2018    Review of Systems:    All systems reviewed and negative except where noted in HPI.   Physical Exam:  BP 131/87 (BP Location: Left Arm, Patient Position: Sitting, Cuff Size: Normal)   Pulse 90   Temp 98.5 F (36.9 C) (Oral)   Ht 5\' 3"  (1.6 m)   Wt 119 lb (54 kg)   BMI 21.08 kg/m  No LMP recorded.  General:   Alert,  Well-developed, well-nourished, pleasant and cooperative in NAD Head:  Normocephalic and atraumatic. Eyes:  Sclera clear, no icterus.   Conjunctiva pink. Ears:  Normal auditory acuity. Nose:  No deformity, discharge, or lesions. Mouth:  No deformity or lesions,oropharynx pink & moist. Neck:  Supple; no masses or thyromegaly. Lungs:  Respirations even and unlabored.  Clear throughout to  auscultation.   No wheezes, crackles, or rhonchi. No acute distress. Heart:  Regular rate and rhythm; no murmurs, clicks, rubs, or gallops. Abdomen:  Normal bowel sounds. Soft, non-tender and non-distended without masses, hepatosplenomegaly or hernias noted.  No guarding or rebound tenderness.   Rectal: Not performed Msk:  Symmetrical without gross deformities. Good, equal movement & strength bilaterally. Pulses:  Normal pulses noted. Extremities:  No clubbing or edema.  No cyanosis. Neurologic:  Alert and oriented x3;  grossly normal neurologically. Skin:  Intact without significant lesions or rashes. No jaundice. Psych:  Alert and cooperative. Normal mood and affect.  Imaging Studies: Reviewed  Assessment and Plan:   Sherry Park is a 34 y.o. Caucasian female with symptoms consistent with chronic nonulcer dyspepsia empirically treated with triple therapy for positive H. pylori serologies, modestly improved associated with significant weight loss. EGD with gastric and duodenal biopsies unremarkable, alpha gal panel, food allergy profile unremarkable, ferritin, iron and TIBC, total IgA and folate levels normal.  Patient was evaluated by general surgery who recommended HIDA scan to evaluate for biliary dyskinesia.  Patient deferred this test at that time as she figured out food triggers causing her symptoms.  She probably has functional dyspepsia.  She cannot tolerate carbohydrates or any sugars.  Her main symptoms are postprandial abdominal bloating and nausea.  She tried 2 weeks course of rifaximin with no benefit.  Low FODMAP diet did not help  Trial of desipramine 25 mg at bedtime Zofran as needed for nausea Discussed about gastric emptying study and HIDA scan as next steps if there is no improvement in her symptoms with above trials   Follow up in 3 months or contact via my chart as needed   Cephas Darby, MD

## 2020-03-20 ENCOUNTER — Ambulatory Visit: Payer: 59 | Admitting: Gastroenterology

## 2020-03-22 ENCOUNTER — Encounter: Payer: Self-pay | Admitting: Gastroenterology

## 2020-03-31 ENCOUNTER — Encounter: Payer: Self-pay | Admitting: Gastroenterology

## 2020-04-03 ENCOUNTER — Other Ambulatory Visit: Payer: Self-pay | Admitting: Gastroenterology

## 2020-04-03 DIAGNOSIS — K30 Functional dyspepsia: Secondary | ICD-10-CM

## 2020-05-07 ENCOUNTER — Encounter: Payer: Self-pay | Admitting: Gastroenterology

## 2020-05-08 ENCOUNTER — Other Ambulatory Visit: Payer: Self-pay | Admitting: Gastroenterology

## 2020-05-08 DIAGNOSIS — K30 Functional dyspepsia: Secondary | ICD-10-CM

## 2020-05-08 MED ORDER — BUSPIRONE HCL 10 MG PO TABS: 10.0000 mg | ORAL_TABLET | Freq: Two times a day (BID) | ORAL | 0 refills | Status: DC

## 2020-05-08 NOTE — Telephone Encounter (Signed)
Last office visit 03/09/2020 function dyspepsia  Last refill 04/03/2020 30 tab 1 refill

## 2020-05-31 ENCOUNTER — Telehealth: Payer: Self-pay

## 2020-05-31 NOTE — Telephone Encounter (Signed)
Patient states she is still having nausea all the time, bloating and burning in stomach. Patient states since starting the desipramine she has not noticed any difference in her symptoms. Patient wants to know if she should try another medication or what she should do. I made her a follow up appointment for 07/12/2020

## 2020-06-02 ENCOUNTER — Other Ambulatory Visit: Payer: Self-pay | Admitting: Gastroenterology

## 2020-06-02 DIAGNOSIS — K30 Functional dyspepsia: Secondary | ICD-10-CM

## 2020-06-02 NOTE — Telephone Encounter (Signed)
Last office visit 03/09/2020 functional dyspepsia  Last refill 05/08/2020 0 refills

## 2020-06-05 ENCOUNTER — Other Ambulatory Visit: Payer: Self-pay | Admitting: Gastroenterology

## 2020-06-05 ENCOUNTER — Encounter: Payer: Self-pay | Admitting: Gastroenterology

## 2020-06-05 DIAGNOSIS — K30 Functional dyspepsia: Secondary | ICD-10-CM

## 2020-06-05 MED ORDER — BUSPIRONE HCL 10 MG PO TABS: 10.0000 mg | ORAL_TABLET | Freq: Two times a day (BID) | ORAL | 1 refills | Status: AC

## 2020-07-10 ENCOUNTER — Other Ambulatory Visit: Payer: Self-pay

## 2020-07-12 ENCOUNTER — Other Ambulatory Visit: Payer: Self-pay

## 2020-07-12 ENCOUNTER — Ambulatory Visit (INDEPENDENT_AMBULATORY_CARE_PROVIDER_SITE_OTHER): Payer: 59 | Admitting: Gastroenterology

## 2020-07-12 ENCOUNTER — Encounter: Payer: Self-pay | Admitting: Gastroenterology

## 2020-07-12 VITALS — BP 125/88 | HR 125 | Temp 98.5°F | Ht 63.0 in | Wt 116.1 lb

## 2020-07-12 DIAGNOSIS — K30 Functional dyspepsia: Secondary | ICD-10-CM | POA: Diagnosis not present

## 2020-07-12 MED ORDER — AMITRIPTYLINE HCL 10 MG PO TABS: 10.0000 mg | ORAL_TABLET | Freq: Every day | ORAL | 0 refills | Status: DC

## 2020-07-12 NOTE — Patient Instructions (Addendum)
Gastric emptying study is scheduled for 08/25/2020 at 8:00am. Please [facility] 7:30am  [facility].  Stop all stomach medications 8 hours before the scan. Nothing to eat or drink after midnight

## 2020-07-12 NOTE — Progress Notes (Signed)
Cephas Darby, MD [address]  [place]  [place], [place] [address]  Main: [phone]  Fax: [phone]    Gastroenterology Consultation  Referring Provider:     Volney American,* Primary Care Physician:  Volney American, PA-C Primary Gastroenterologist:  Dr. Cephas Darby Reason for Consultation:   Functional dyspepsia        HPI:   Sherry Park is a 34 y.o. female referred by Dr. Orene Desanctis, Lilia Argue, PA-C  for consultation & management of chronic dyspepsia.  Patient has been experiencing nausea, early satiety, bloating, burping since July, got worse in August.  Due to persistent symptoms, she was empirically treated for H. pylori infection with triple therapy based on positive serologies.  Her symptoms improved but continues to have early satiety associated with some bloating and burping. She just started taking probiotics which seems to help with bloating.  She denies weight loss, diarrhea.  She denies any other GI symptoms.  Currently, not on PPI  Follow-up visit 09/15/2018 Her symptoms are persistent and would like to get an EGD.  She noticed that eating carbs particularly are making her symptoms worse.  She continues to lose weight.  She lost about 16 pounds since 05/2018.  She tried FD guard which provided some symptomatic relief.  She continues to report fatigue.  Her H. pylori breath test was negative, B12 normal,  serum TTG IgA negative.  Unknown total IgA levels.  Vitamin D levels normal.  She does not report any anxiety, stress or depression  Follow-up visit 11/17/2018 Patient underwent EGD with duodenal and gastric biopsies which were negative.  Food allergy testing also came back unremarkable.  She saw Dr. Perrin Maltese to evaluate for gallbladder etiology, he felt her symptoms could be related to gallbladder dyskinesia or functional GI disorder and recommended HIDA scan.  However, patient decided to wait for the HIDA scan.  Today, she reports feeling  better, appears to be in good spirits, felt happy as she gained about 4 pounds since last visit.  She brought a list of foods that she noticed triggering her symptoms which were sugary substances, eggs, milk, apple sauce, grapes, watermelon, sweetened tea.  Prior to all the symptoms, she was drinking a lot of sodas.  She would like to know if she has any fructose or sucrose intolerance.  Follow-up virtual visit 04/30/2019 Sherry Park has been seeing me for management of functional dyspepsia.  She reports her symptoms are more or less the same.  She was seen by Dr. Owens Shark to evaluate for any gallbladder etiology.  She deferred HIDA scan at this time.  She has tried low FODMAP diet, and found that some of the foods that are listed in the low FODMAPs for example potatoes and wheat which she is not tolerating.  Her primary symptom is significant abdominal bloating.  She wanted to get fructose and sucrose breath test and confirmed appointment at [facility] on 05/14/2019.  We have tried align, Culturelle.  Align provided mild relief.  Culturelle was not bloating.  She did not find any benefit from rifaximin.  She tried FD guard which did not seem to help.  Currently on low FODMAPs diet.  Her weight is stable  Follow-up visit 03/09/2020 Patient has not seen me for 1 year. With change in her insurance, she saw [facility], Dr. Chales Abrahams on 10/26/2019 as a televisit.  She underwent hydrogen breath test which was negative.  She was suggested about gastric emptying study.  She did not undergo fructose or sucrose intolerance test.  Patient reports that her postprandial bloating as well as nausea is persistent, resulting in decreased p.o. intake.  Reports having regular bowel movements.  Her weight is overall stable.  Food allergy profile and alpha gal panel were negative.  Patient was also evaluated by cardiology for chest pain and palpitations, cardiac work-up negative  Follow-up visit 07/22/2020 She continues to have postprandial  bloating, nausea and fullness.  She is currently on buspirone 10 mg twice daily with not much relief.  Her weight has been stable.  She is interested to undergo gastric emptying study and also would like to discuss any other alternative medication  NSAIDs: None  Antiplts/Anticoagulants/Anti thrombotics: None  GI Procedures:  EGD 09/24/2018 - Normal duodenal bulb and second portion of the duodenum. Biopsied. - A few gastric polyps. - Normal stomach. Biopsied. - Normal gastroesophageal junction and esophagus. DIAGNOSIS:  A. DUODENUM; COLD BIOPSY:  - ENTERIC MUCOSA WITH PRESERVED VILLOUS ARCHITECTURE AND NO SIGNIFICANT  HISTOPATHOLOGIC CHANGE.  - NEGATIVE FOR FEATURES OF CELIAC, DYSPLASIA, AND MALIGNANCY.   B. STOMACH, RANDOM; COLD BIOPSY:  - OXYNTIC MUCOSA WITH MINIMAL CHRONIC GASTRITIS AND OXYNTIC GLAND  HYPERPLASIA, SEE NOTE.  - NEGATIVE FOR H. PYLORI, DYSPLASIA AND MALIGNANCY.  She denies family history of GI malignancy She does not smoke or drink alcohol Denies any GI surgeries  Past Medical History:  Diagnosis Date  . Anxiety   . Headache    a. felt to be 2/2 Dostinex  . Hemorrhoids   . Hyperprolactinemia ([place])   . Microprolactinoma ([place])    a. on Dostinex; b. followed by Dr. Gabriel Carina, MD  . Palpitations    a. 04/2016 Event Monitor: 1 episode of narrow complex tachycardia @ 163 - likely sinus tach though SVT could not be excluded (happened during "anxiety and panic attack"); b. 04/2016 Echo: EF 60-65%, no rwma. Nl LA size. Nl RV fxn.  Marland Kitchen PCOS (polycystic ovarian syndrome)     Past Surgical History:  Procedure Laterality Date  . ESOPHAGOGASTRODUODENOSCOPY (EGD) WITH PROPOFOL N/A 09/24/2018   Procedure: ESOPHAGOGASTRODUODENOSCOPY (EGD) WITH PROPOFOL;  Surgeon: Lin Landsman, MD;  Location: [place];  Service: Gastroenterology;  Laterality: N/A;  . NO PAST SURGERIES     verified with patient- 09/09/17    Current Outpatient Medications:  .  busPIRone (BUSPAR) 10  MG tablet, Take by mouth., Disp: , Rfl:  .  cabergoline (DOSTINEX) 0.5 MG tablet, Take 0.25 mg by mouth 2 (two) times a week. , Disp: , Rfl:  .  desipramine (NORPRAMIN) 25 MG tablet, Take 1 tablet by mouth daily., Disp: , Rfl:  .  ERRIN 0.35 MG tablet, Take 1 tablet by mouth daily., Disp: , Rfl: 3 .  Multiple Vitamins-Minerals (MULTIVITAMIN ADULTS) TABS, Take by mouth., Disp: , Rfl:  .  ondansetron (ZOFRAN) 4 MG tablet, Take 1 tablet (4 mg total) by mouth every 8 (eight) hours as needed for nausea or vomiting., Disp: 30 tablet, Rfl: 1 .  amitriptyline (ELAVIL) 10 MG tablet, Take 1 tablet (10 mg total) by mouth at bedtime., Disp: 30 tablet, Rfl: 0   Family History  Problem Relation Age of Onset  . Diabetes Mellitus II Mother   . Hypertension Mother   . Stroke Mother        disabling stroke from oral contraceptive pill use at 32 years of age, no family hx of CVA  . Kidney disease Father   . Hypertension Sister   . Congestive Heart  Failure Maternal Grandmother   . Kidney disease Maternal Grandmother   . Cancer Paternal Grandfather        skin  . Hematuria Neg Hx   . Kidney cancer Neg Hx   . Prostate cancer Neg Hx   . Sickle cell trait Neg Hx   . Tuberculosis Neg Hx      Social History   Tobacco Use  . Smoking status: Never Smoker  . Smokeless tobacco: Never Used  Vaping Use  . Vaping Use: Never used  Substance Use Topics  . Alcohol use: No  . Drug use: No    Allergies as of 07/12/2020 - Review Complete 07/12/2020  Allergen Reaction Noted  . Doxycycline Other (See Comments) 06/03/2017  . Latex Itching 07/01/2016  . Pantoprazole Other (See Comments) 07/10/2018    Review of Systems:    All systems reviewed and negative except where noted in HPI.   Physical Exam:  BP 125/88 (BP Location: Left Arm, Patient Position: Sitting, Cuff Size: Normal)   Pulse (!) 125   Temp 98.5 F (36.9 C) (Oral)   Ht 5\' 3"  (1.6 m)   Wt 116 lb 2 oz (52.7 kg)   BMI 20.57 kg/m  No LMP  recorded.  General:   Alert,  Well-developed, well-nourished, pleasant and cooperative in NAD Head:  Normocephalic and atraumatic. Eyes:  Sclera clear, no icterus.   Conjunctiva pink. Ears:  Normal auditory acuity. Nose:  No deformity, discharge, or lesions. Mouth:  No deformity or lesions,oropharynx pink & moist. Neck:  Supple; no masses or thyromegaly. Lungs:  Respirations even and unlabored.  Clear throughout to auscultation.   No wheezes, crackles, or rhonchi. No acute distress. Heart:  Regular rate and rhythm; no murmurs, clicks, rubs, or gallops. Abdomen:  Normal bowel sounds. Soft, non-tender and non-distended without masses, hepatosplenomegaly or hernias noted.  No guarding or rebound tenderness.   Rectal: Not performed Msk:  Symmetrical without gross deformities. Good, equal movement & strength bilaterally. Pulses:  Normal pulses noted. Extremities:  No clubbing or edema.  No cyanosis. Neurologic:  Alert and oriented x3;  grossly normal neurologically. Skin:  Intact without significant lesions or rashes. No jaundice. Psych:  Alert and cooperative. Normal mood and affect.  Imaging Studies: Reviewed  Assessment and Plan:   Sherry Park is a 34 y.o. Caucasian female with symptoms consistent with chronic nonulcer dyspepsia empirically treated with triple therapy for positive H. pylori serologies, modestly improved associated with significant weight loss. EGD with gastric and duodenal biopsies unremarkable, alpha gal panel, food allergy profile unremarkable, ferritin, iron and TIBC, total IgA and folate levels normal.  Patient was evaluated by general surgery who recommended HIDA scan to evaluate for biliary dyskinesia.  Patient deferred this test at that time as she figured out food triggers causing her symptoms.  She probably has functional dyspepsia.  She cannot tolerate carbohydrates or any sugars.  Her main symptoms are postprandial abdominal bloating and nausea.  She tried 2  weeks course of rifaximin with no benefit.  Low FODMAP diet did not help.  Desipramine did not help.  She tried buspirone which did not help as well  Will try amitriptyline 10 mg at bedtime Recommend gastric emptying study Zofran as needed for nausea  Follow up in 3 months or contact via my chart as needed   Cephas Darby, MD

## 2020-08-01 ENCOUNTER — Other Ambulatory Visit: Payer: Self-pay

## 2020-08-01 ENCOUNTER — Ambulatory Visit (INDEPENDENT_AMBULATORY_CARE_PROVIDER_SITE_OTHER): Payer: 59

## 2020-08-01 DIAGNOSIS — Z23 Encounter for immunization: Secondary | ICD-10-CM | POA: Diagnosis not present

## 2020-08-11 ENCOUNTER — Other Ambulatory Visit: Payer: Self-pay | Admitting: Gastroenterology

## 2020-08-14 NOTE — Telephone Encounter (Signed)
Last office visit 07/12/2020 Functional dyspepsia Last refill 07/12/2020 0 refills  Supposed to follow up in 3 months no appointment is scheduled

## 2020-08-25 ENCOUNTER — Ambulatory Visit
Admission: RE | Admit: 2020-08-25 | Discharge: 2020-08-25 | Disposition: A | Discharge status: Home / Self Care | Payer: 59 | Source: Ambulatory Visit | Attending: Gastroenterology | Admitting: Gastroenterology

## 2020-08-25 ENCOUNTER — Other Ambulatory Visit: Payer: Self-pay

## 2020-08-25 DIAGNOSIS — K30 Functional dyspepsia: Secondary | ICD-10-CM | POA: Diagnosis not present

## 2020-08-25 IMAGING — NM NM GASTRIC EMPTYING
5 series · 16 of 16 positions shown · non-contrast
Comparison: CT [DATE]

CLINICAL DATA: Functional dyspepsia.

EXAM:
NUCLEAR MEDICINE GASTRIC EMPTYING SCAN
TECHNIQUE: After oral ingestion of radiolabeled meal, sequential abdominal
images were obtained for 120 minutes. Residual percentage of
activity remaining within the stomach was calculated at 60 and 120
minutes. Patient only ingested [DATE] of prescribed meal and dosage.
RADIOPHARMACEUTICALS:  1.53 mCi [Q6] sulfur colloid.

[Series 1000: gastric statics · 3.90mm/px · 2 of 2 frames shown (1 of 4)]
[frame 1/2]
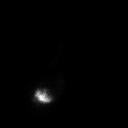
[frame 2/2]
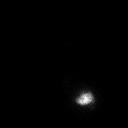

[Series 1000: gastric statics · 3.90mm/px · 2 of 2 frames shown (2 of 4)]
[frame 1/2]
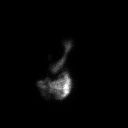
[frame 2/2]
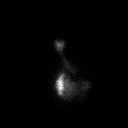

[Series 1000: gastric statics (results) · 3.90mm/px · 4 acquisitions, 8 frames shown]
[im 1/4]
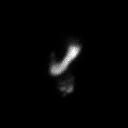
[im 1/4]
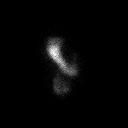
[im 2/4]
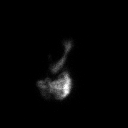
[im 2/4]
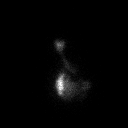
[im 3/4]
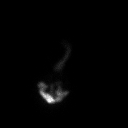
[im 3/4]
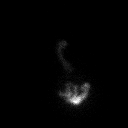
[im 4/4]
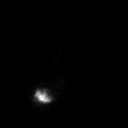
[im 4/4]
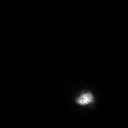

[Series 1000: gastric statics · 3.90mm/px · 2 of 2 frames shown (3 of 4)]
[frame 1/2]
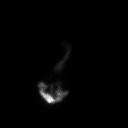
[frame 2/2]
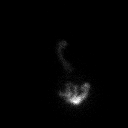

[Series 1000: gastric statics · 3.90mm/px · 2 of 2 frames shown (4 of 4)]
[frame 1/2]
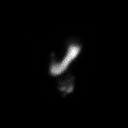
[frame 2/2]
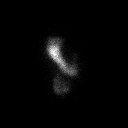

[16 of 16 positions shown; findings below may reference images not displayed]

FINDINGS: AP and oblique imaging after the patient ingested the
radiopharmaceutical in oatmeal p.o. There is 9% residual gastric
activity at 120 minutes (normal less than 30%).
IMPRESSION: Gastric emptying is normal.

## 2020-08-25 MED ORDER — TECHNETIUM TC 99M SULFUR COLLOID
2.2600 | Freq: Once | INTRAVENOUS | Status: AC | PRN
  Administered 2020-08-25: 1.53 via INTRAVENOUS

## 2020-08-28 ENCOUNTER — Encounter: Payer: Self-pay | Admitting: Gastroenterology

## 2020-08-28 MED ORDER — AMITRIPTYLINE HCL 25 MG PO TABS: 25.0000 mg | ORAL_TABLET | Freq: Every day | ORAL | 0 refills | Status: DC

## 2020-09-14 NOTE — Telephone Encounter (Signed)
consent

## 2020-09-27 ENCOUNTER — Other Ambulatory Visit: Payer: Self-pay | Admitting: Gastroenterology

## 2020-09-27 NOTE — Telephone Encounter (Signed)
Last refill 08/28/2020 0 refills 30 tab  Last office visit 07/12/2020

## 2021-03-29 ENCOUNTER — Other Ambulatory Visit: Payer: Self-pay | Admitting: Gastroenterology

## 2021-07-03 ENCOUNTER — Other Ambulatory Visit: Payer: Self-pay | Admitting: Gastroenterology

## 2021-07-09 ENCOUNTER — Other Ambulatory Visit: Payer: Self-pay

## 2021-07-10 ENCOUNTER — Ambulatory Visit (INDEPENDENT_AMBULATORY_CARE_PROVIDER_SITE_OTHER): Payer: 59 | Admitting: Gastroenterology

## 2021-07-10 ENCOUNTER — Encounter: Payer: Self-pay | Admitting: Gastroenterology

## 2021-07-10 ENCOUNTER — Other Ambulatory Visit: Payer: Self-pay

## 2021-07-10 VITALS — BP 129/86 | HR 103 | Temp 98.0°F | Ht 63.0 in | Wt 133.4 lb

## 2021-07-10 DIAGNOSIS — K30 Functional dyspepsia: Secondary | ICD-10-CM | POA: Diagnosis not present

## 2021-07-10 MED ORDER — AMITRIPTYLINE HCL 10 MG PO TABS: 20.0000 mg | ORAL_TABLET | Freq: Every day | ORAL | 3 refills | Status: DC

## 2021-07-10 NOTE — Progress Notes (Signed)
Cephas Darby, MD [address]  [place] [address]  Main: [phone]  Fax: [phone]    Gastroenterology Consultation  Referring Provider:     Jon Billings, NP Primary Care Physician:  Jon Billings, NP Primary Gastroenterologist:  Dr. Cephas Darby Reason for Consultation:   Functional dyspepsia        HPI:   Sherry Park is a 35 y.o. female referred by Dr. Jon Billings, NP  for consultation & management of functional dyspepsia.  Patient has been experiencing nausea, early satiety, bloating, burping since July 2019, got worse in August 2019.  Due to persistent symptoms, she was empirically treated for H. pylori infection with triple therapy based on positive serologies.  Her symptoms improved but continued to have early satiety associated with some bloating and burping.  She underwent gastric emptying study which was normal.  EGD was unremarkable.  I switched her from buspirone to amitriptyline 25 mg at bedtime 1 year ago.  Patient reports that her symptoms are significantly improved and has also helped to gain weight.  She does notice intermittent burping.  She is no longer experiencing early satiety and abdominal bloating.   NSAIDs: None  Antiplts/Anticoagulants/Anti thrombotics: None  GI Procedures:   Nuclear medicine gastric emptying study 08/27/2020 Normal  EGD 09/24/2018 - Normal duodenal bulb and second portion of the duodenum. Biopsied. - A few gastric polyps. - Normal stomach. Biopsied. - Normal gastroesophageal junction and esophagus. DIAGNOSIS:  A. DUODENUM; COLD BIOPSY:  - ENTERIC MUCOSA WITH PRESERVED VILLOUS ARCHITECTURE AND NO SIGNIFICANT  HISTOPATHOLOGIC CHANGE.  - NEGATIVE FOR FEATURES OF CELIAC, DYSPLASIA, AND MALIGNANCY.   B. STOMACH, RANDOM; COLD BIOPSY:  - OXYNTIC MUCOSA WITH MINIMAL CHRONIC GASTRITIS AND OXYNTIC GLAND  HYPERPLASIA, SEE NOTE.  - NEGATIVE FOR H. PYLORI, DYSPLASIA AND  MALIGNANCY.  She denies family history of GI malignancy She does not smoke or drink alcohol Denies any GI surgeries  Past Medical History:  Diagnosis Date   Anxiety    Headache    a. felt to be 2/2 Dostinex   Hemorrhoids    Hyperprolactinemia ([place])    Microprolactinoma ([place])    a. on Dostinex; b. followed by Dr. Gabriel Carina, MD   Palpitations    a. 04/2016 Event Monitor: 1 episode of narrow complex tachycardia @ 163 - likely sinus tach though SVT could not be excluded (happened during "anxiety and panic attack"); b. 04/2016 Echo: EF 60-65%, no rwma. Nl LA size. Nl RV fxn.   PCOS (polycystic ovarian syndrome)     Past Surgical History:  Procedure Laterality Date   ESOPHAGOGASTRODUODENOSCOPY (EGD) WITH PROPOFOL N/A 09/24/2018   Procedure: ESOPHAGOGASTRODUODENOSCOPY (EGD) WITH PROPOFOL;  Surgeon: Lin Landsman, MD;  Location: [facility] ENDOSCOPY;  Service: Gastroenterology;  Laterality: N/A;   NO PAST SURGERIES     verified with patient- 09/09/17    Current Outpatient Medications:    amitriptyline (ELAVIL) 10 MG tablet, Take 2 tablets (20 mg total) by mouth at bedtime., Disp: 180 tablet, Rfl: 3   cabergoline (DOSTINEX) 0.5 MG tablet, Take 0.25 mg by mouth 2 (two) times a week. , Disp: , Rfl:    ERRIN 0.35 MG tablet, Take 1 tablet by mouth daily., Disp: , Rfl: 3   Multiple Vitamins-Minerals (MULTIVITAMIN ADULTS) TABS, Take by mouth., Disp: , Rfl:    SUMAtriptan (IMITREX) 100 MG tablet, PLEASE SEE ATTACHED FOR DETAILED DIRECTIONS, Disp: , Rfl:    Family History  Problem Relation Age of Onset  Diabetes Mellitus II Mother    Hypertension Mother    Stroke Mother        disabling stroke from oral contraceptive pill use at 33 years of age, no family hx of CVA   Kidney disease Father    Hypertension Sister    Congestive Heart Failure Maternal Grandmother    Kidney disease Maternal Grandmother    Cancer Paternal Grandfather        skin   Hematuria Neg Hx    Kidney cancer Neg Hx     Prostate cancer Neg Hx    Sickle cell trait Neg Hx    Tuberculosis Neg Hx      Social History   Tobacco Use   Smoking status: Never   Smokeless tobacco: Never  Vaping Use   Vaping Use: Never used  Substance Use Topics   Alcohol use: No   Drug use: No    Allergies as of 07/10/2021 - Review Complete 07/10/2021  Allergen Reaction Noted   Doxycycline Other (See Comments) 06/03/2017   Latex Itching 07/01/2016   Pantoprazole Other (See Comments) 07/10/2018    Review of Systems:    All systems reviewed and negative except where noted in HPI.   Physical Exam:  BP 129/86 (BP Location: Left Arm, Patient Position: Sitting, Cuff Size: Normal)   Pulse (!) 103   Temp 98 F (36.7 C) (Oral)   Ht 5\' 3"  (1.6 m)   Wt 133 lb 6.4 oz (60.5 kg)   BMI 23.63 kg/m  No LMP recorded.  General:   Alert,  Well-developed, well-nourished, pleasant and cooperative in NAD Head:  Normocephalic and atraumatic. Eyes:  Sclera clear, no icterus.   Conjunctiva pink. Ears:  Normal auditory acuity. Nose:  No deformity, discharge, or lesions. Mouth:  No deformity or lesions,oropharynx pink & moist. Neck:  Supple; no masses or thyromegaly. Lungs:  Respirations even and unlabored.  Clear throughout to auscultation.   No wheezes, crackles, or rhonchi. No acute distress. Heart:  Regular rate and rhythm; no murmurs, clicks, rubs, or gallops. Abdomen:  Normal bowel sounds. Soft, non-tender and non-distended without masses, hepatosplenomegaly or hernias noted.  No guarding or rebound tenderness.   Rectal: Not performed Msk:  Symmetrical without gross deformities. Good, equal movement & strength bilaterally. Pulses:  Normal pulses noted. Extremities:  No clubbing or edema.  No cyanosis. Neurologic:  Alert and oriented x3;  grossly normal neurologically. Skin:  Intact without significant lesions or rashes. No jaundice. Psych:  Alert and cooperative. Normal mood and affect.  Imaging  Studies: Reviewed  Assessment and Plan:   Sherry Park is a 35 y.o. Caucasian female with symptoms consistent with chronic nonulcer dyspepsia empirically treated with triple therapy for positive H. pylori serologies, modestly improved associated with significant weight loss. EGD with gastric and duodenal biopsies unremarkable, alpha gal panel, food allergy profile unremarkable, ferritin, iron and TIBC, total IgA and folate levels normal.  Patient was evaluated by general surgery who recommended HIDA scan to evaluate for biliary dyskinesia.  Patient deferred this test at that time as she figured out food triggers causing her symptoms.  She probably has functional dyspepsia.  She cannot tolerate carbohydrates or any sugars.  Her main symptoms are postprandial abdominal bloating and nausea.  She also tried 2 weeks course of rifaximin with no benefit.  Low FODMAP diet did not help.  Desipramine did not help.  She tried buspirone which did not help as well.  Amitriptyline 25 mg at bedtime significantly helped  with her symptoms of dyspepsia.  With ongoing weight gain, will decrease to 20 mg at bedtime. New prescription sent   Follow up annually or contact via my chart as needed   Cephas Darby, MD

## 2021-08-06 ENCOUNTER — Encounter: Payer: 59 | Admitting: Nurse Practitioner

## 2021-08-07 ENCOUNTER — Other Ambulatory Visit: Payer: Self-pay

## 2021-08-07 ENCOUNTER — Encounter: Payer: Self-pay | Admitting: Nurse Practitioner

## 2021-08-07 ENCOUNTER — Ambulatory Visit (INDEPENDENT_AMBULATORY_CARE_PROVIDER_SITE_OTHER): Payer: 59 | Admitting: Nurse Practitioner

## 2021-08-07 VITALS — BP 125/80 | HR 126 | Temp 99.2°F | Ht 63.0 in | Wt 134.0 lb

## 2021-08-07 DIAGNOSIS — Z0000 Encounter for general adult medical examination without abnormal findings: Secondary | ICD-10-CM | POA: Diagnosis not present

## 2021-08-07 DIAGNOSIS — Z1159 Encounter for screening for other viral diseases: Secondary | ICD-10-CM

## 2021-08-07 DIAGNOSIS — Z114 Encounter for screening for human immunodeficiency virus [HIV]: Secondary | ICD-10-CM | POA: Diagnosis not present

## 2021-08-07 DIAGNOSIS — R8290 Unspecified abnormal findings in urine: Secondary | ICD-10-CM

## 2021-08-07 DIAGNOSIS — Z23 Encounter for immunization: Secondary | ICD-10-CM

## 2021-08-07 LAB — URINALYSIS, ROUTINE W REFLEX MICROSCOPIC
Bilirubin, UA: NEGATIVE
Glucose, UA: NEGATIVE
Ketones, UA: NEGATIVE
Nitrite, UA: NEGATIVE
Specific Gravity, UA: 1.02 (ref 1.005–1.030)
Urobilinogen, Ur: 1 mg/dL (ref 0.2–1.0)
pH, UA: 8 — ABNORMAL HIGH (ref 5.0–7.5)

## 2021-08-07 LAB — MICROSCOPIC EXAMINATION: Bacteria, UA: NONE SEEN

## 2021-08-07 NOTE — Progress Notes (Signed)
BP 125/80   Pulse (!) 126   Temp 99.2 F (37.3 C) (Oral)   Ht 5\' 3"  (1.6 m)   Wt 134 lb (60.8 kg)   LMP 07/28/2021 (Exact Date)   SpO2 98%   BMI 23.74 kg/m    Subjective:    Patient ID: Sherry Park, female    DOB: 28-Dec-1985, 35 y.o.   MRN: [id number]  HPI: Sherry Park is a 35 y.o. female presenting on 08/07/2021 for comprehensive medical examination. Current medical complaints include:none  She currently lives with: Menopausal Symptoms: no   Denies HA, CP, SOB, dizziness, palpitations, visual changes, and lower extremity swelling.  Depression Screen done today and results listed below:  Depression screen [facility] 2/9 08/07/2021 05/21/2019 08/07/2018 06/10/2018 05/19/2017  Decreased Interest 0 0 1 1 0  Down, Depressed, Hopeless 0 0 0 1 0  PHQ - 2 Score 0 0 1 2 0  Altered sleeping 0 0 1 2 1   Tired, decreased energy 1 0 1 2 1   Change in appetite 0 0 0 3 1  Feeling bad or failure about yourself  0 0 0 0 0  Trouble concentrating 0 0 0 1 0  Moving slowly or fidgety/restless 0 0 0 0 0  Suicidal thoughts 0 0 0 0 0  PHQ-9 Score 1 0 3 10 3   Difficult doing work/chores Not difficult at all Not difficult at all - - -    The patient does not have a history of falls. I did complete a risk assessment for falls. A plan of care for falls was documented.   Past Medical History:  Past Medical History:  Diagnosis Date   Anxiety    Functional dyspepsia    Headache    a. felt to be 2/2 Dostinex   Hemorrhoids    Hyperprolactinemia ([place])    Microprolactinoma ([place])    a. on Dostinex; b. followed by Dr. Gabriel Carina, MD   Palpitations    a. 04/2016 Event Monitor: 1 episode of narrow complex tachycardia @ 163 - likely sinus tach though SVT could not be excluded (happened during "anxiety and panic attack"); b. 04/2016 Echo: EF 60-65%, no rwma. Nl LA size. Nl RV fxn.   PCOS (polycystic ovarian syndrome)     Surgical History:  Past Surgical History:  Procedure Laterality Date    ESOPHAGOGASTRODUODENOSCOPY (EGD) WITH PROPOFOL N/A 09/24/2018   Procedure: ESOPHAGOGASTRODUODENOSCOPY (EGD) WITH PROPOFOL;  Surgeon: Lin Landsman, MD;  Location: [facility] ENDOSCOPY;  Service: Gastroenterology;  Laterality: N/A;   NO PAST SURGERIES     verified with patient- 09/09/17    Medications:  Current Outpatient Medications on File Prior to Visit  Medication Sig   amitriptyline (ELAVIL) 10 MG tablet Take 2 tablets (20 mg total) by mouth at bedtime. (Patient taking differently: Take 10 mg by mouth at bedtime.)   cabergoline (DOSTINEX) 0.5 MG tablet Take 0.25 mg by mouth once a week.   ERRIN 0.35 MG tablet Take 1 tablet by mouth daily.   Multiple Vitamins-Minerals (MULTIVITAMIN ADULTS) TABS Take by mouth.   SUMAtriptan (IMITREX) 100 MG tablet PLEASE SEE ATTACHED FOR DETAILED DIRECTIONS   No current facility-administered medications on file prior to visit.    Allergies:  Allergies  Allergen Reactions   Doxycycline Other (See Comments)    Thrush and rash Thrush and rash Thrush and rash   Latex Itching   Pantoprazole Other (See Comments)    abdominal pain abdominal pain abdominal pain    Social History:  Social History   Socioeconomic History   Marital status: Single    Spouse name: Not on file   Number of children: Not on file   Years of education: Not on file   Highest education level: Not on file  Occupational History   Not on file  Tobacco Use   Smoking status: Never   Smokeless tobacco: Never  Vaping Use   Vaping Use: Never used  Substance and Sexual Activity   Alcohol use: No   Drug use: No   Sexual activity: Yes    Birth control/protection: Pill  Other Topics Concern   Not on file  Social History Narrative   Not on file   Social Determinants of Health   Financial Resource Strain: Not on file  Food Insecurity: Not on file  Transportation Needs: Not on file  Physical Activity: Not on file  Stress: Not on file  Social Connections: Not on file   Intimate Partner Violence: Not on file   Social History   Tobacco Use  Smoking Status Never  Smokeless Tobacco Never   Social History   Substance and Sexual Activity  Alcohol Use No    Family History:  Family History  Problem Relation Age of Onset   Diabetes Mellitus II Mother    Hypertension Mother    Stroke Mother        disabling stroke from oral contraceptive pill use at 35 years of age, no family hx of CVA   Kidney disease Father    Hypertension Sister    Congestive Heart Failure Maternal Grandmother    Kidney disease Maternal Grandmother    Cancer Paternal Grandfather        skin   Hematuria Neg Hx    Kidney cancer Neg Hx    Prostate cancer Neg Hx    Sickle cell trait Neg Hx    Tuberculosis Neg Hx     Past medical history, surgical history, medications, allergies, family history and social history reviewed with patient today and changes made to appropriate areas of the chart.   Review of Systems  Eyes:  Negative for blurred vision and double vision.  Respiratory:  Negative for shortness of breath.   Cardiovascular:  Negative for chest pain, palpitations and leg swelling.  Neurological:  Negative for dizziness and headaches.  All other ROS negative except what is listed above and in the HPI.      Objective:    BP 125/80   Pulse (!) 126   Temp 99.2 F (37.3 C) (Oral)   Ht 5\' 3"  (1.6 m)   Wt 134 lb (60.8 kg)   LMP 07/28/2021 (Exact Date)   SpO2 98%   BMI 23.74 kg/m   Wt Readings from Last 3 Encounters:  08/07/21 134 lb (60.8 kg)  07/10/21 133 lb 6.4 oz (60.5 kg)  07/12/20 116 lb 2 oz (52.7 kg)    Physical Exam Vitals and nursing note reviewed.  Constitutional:      General: She is awake. She is not in acute distress.    Appearance: She is well-developed. She is not ill-appearing.  HENT:     Head: Normocephalic and atraumatic.     Right Ear: Hearing, tympanic membrane, ear canal and external ear normal. No drainage.     Left Ear: Hearing,  tympanic membrane, ear canal and external ear normal. No drainage.     Nose: Nose normal.     Right Sinus: No maxillary sinus tenderness or frontal sinus tenderness.  Left Sinus: No maxillary sinus tenderness or frontal sinus tenderness.     Mouth/Throat:     Mouth: Mucous membranes are moist.     Pharynx: Oropharynx is clear. Uvula midline. No pharyngeal swelling, oropharyngeal exudate or posterior oropharyngeal erythema.  Eyes:     General: Lids are normal.        Right eye: No discharge.        Left eye: No discharge.     Extraocular Movements: Extraocular movements intact.     Conjunctiva/sclera: Conjunctivae normal.     Pupils: Pupils are equal, round, and reactive to light.     Visual Fields: Right eye visual fields normal and left eye visual fields normal.  Neck:     Thyroid: No thyromegaly.     Vascular: No carotid bruit.     Trachea: Trachea normal.  Cardiovascular:     Rate and Rhythm: Normal rate and regular rhythm.     Heart sounds: Normal heart sounds. No murmur heard.   No gallop.  Pulmonary:     Effort: Pulmonary effort is normal. No accessory muscle usage or respiratory distress.     Breath sounds: Normal breath sounds.  Chest:  Breasts:    Right: Normal.     Left: Normal.  Abdominal:     General: Bowel sounds are normal.     Palpations: Abdomen is soft. There is no hepatomegaly or splenomegaly.     Tenderness: There is no abdominal tenderness.  Musculoskeletal:        General: Normal range of motion.     Cervical back: Normal range of motion and neck supple.     Right lower leg: No edema.     Left lower leg: No edema.  Lymphadenopathy:     Head:     Right side of head: No submental, submandibular, tonsillar, preauricular or posterior auricular adenopathy.     Left side of head: No submental, submandibular, tonsillar, preauricular or posterior auricular adenopathy.     Cervical: No cervical adenopathy.     Upper Body:     Right upper body: No  supraclavicular, axillary or pectoral adenopathy.     Left upper body: No supraclavicular, axillary or pectoral adenopathy.  Skin:    General: Skin is warm and dry.     Capillary Refill: Capillary refill takes less than 2 seconds.     Findings: No rash.  Neurological:     Mental Status: She is alert and oriented to person, place, and time.     Gait: Gait is intact.     Deep Tendon Reflexes: Reflexes are normal and symmetric.     Reflex Scores:      Brachioradialis reflexes are 2+ on the right side and 2+ on the left side.      Patellar reflexes are 2+ on the right side and 2+ on the left side. Psychiatric:        Attention and Perception: Attention normal.        Mood and Affect: Mood normal.        Speech: Speech normal.        Behavior: Behavior normal. Behavior is cooperative.        Thought Content: Thought content normal.        Judgment: Judgment normal.    Results for orders placed or performed in visit on 06/09/19  Ferritin  Result Value Ref Range   Ferritin 56 15 - 150 ng/mL  Iron Binding Cap (TIBC)([facility])  Result Value Ref Range  Total Iron Binding Capacity 336 250 - 450 ug/dL   UIBC 165 131 - 425 ug/dL   Iron 171 (H) 27 - 159 ug/dL   Iron Saturation 51 15 - 55 %  Vitamin B12  Result Value Ref Range   Vitamin B-12 490 232 - 1,245 pg/mL  Folate  Result Value Ref Range   Folate >20.0 >3.0 ng/mL  CBC with Differential/Platelet  Result Value Ref Range   WBC 5.8 3.4 - 10.8 x10E3/uL   RBC 4.45 3.77 - 5.28 x10E6/uL   Hemoglobin 13.8 11.1 - 15.9 g/dL   Hematocrit 40.8 34.0 - 46.6 %   MCV 92 79 - 97 fL   MCH 31.0 26.6 - 33.0 pg   MCHC 33.8 31.5 - 35.7 g/dL   RDW 11.8 11.7 - 15.4 %   Platelets 196 150 - 450 x10E3/uL   Neutrophils 65 Not Estab. %   Lymphs 26 Not Estab. %   Monocytes 7 Not Estab. %   Eos 1 Not Estab. %   Basos 1 Not Estab. %   Neutrophils Absolute 3.8 1.4 - 7.0 x10E3/uL   Lymphocytes Absolute 1.5 0.7 - 3.1 x10E3/uL   Monocytes  Absolute 0.4 0.1 - 0.9 x10E3/uL   EOS (ABSOLUTE) 0.1 0.0 - 0.4 x10E3/uL   Basophils Absolute 0.1 0.0 - 0.2 x10E3/uL   Immature Granulocytes 0 Not Estab. %   Immature Grans (Abs) 0.0 0.0 - 0.1 T03T4/SF  Basic metabolic panel  Result Value Ref Range   Glucose 91 65 - 99 mg/dL   BUN 12 6 - 20 mg/dL   Creatinine, Ser 0.70 0.57 - 1.00 mg/dL   GFR calc non Af Amer 116 >59 mL/min/1.73   GFR calc Af Amer 134 >59 mL/min/1.73   BUN/Creatinine Ratio 17 9 - 23   Sodium 141 134 - 144 mmol/L   Potassium 3.9 3.5 - 5.2 mmol/L   Chloride 103 96 - 106 mmol/L   CO2 22 20 - 29 mmol/L   Calcium 9.7 8.7 - 10.2 mg/dL      Assessment & Plan:   Problem List Items Addressed This Visit   None Visit Diagnoses     Annual physical exam    -  Primary   Health maintenance reviewed during visit today. Labs ordered. PAP up to date. Flu shot given.   Relevant Orders   CBC with Differential/Platelet   Comprehensive metabolic panel   Lipid panel   TSH   Urinalysis, Routine w reflex microscopic   Encounter for hepatitis C screening test for low risk patient       Relevant Orders   Hepatitis C Antibody   Screening for HIV (human immunodeficiency virus)       Relevant Orders   HIV Antibody (routine testing w rflx)   Need for influenza vaccination       Relevant Orders   Flu Vaccine QUAD 64mo+IM (Fluarix, Fluzone & Alfiuria Quad PF) (Completed)        Follow up plan: Return in about 1 year (around 08/07/2022) for Physical and Fasting labs.   LABORATORY TESTING:  - Pap smear: up to date  IMMUNIZATIONS:   - Tdap: Tetanus vaccination status reviewed: last tetanus booster within 10 years. - Influenza: Administered today - Pneumovax: Not applicable - Prevnar: Not applicable - HPV: Up to date - Zostavax vaccine: Not applicable  SCREENING: -Mammogram: Not applicable  - Colonoscopy: Not applicable  - Bone Density: Not applicable  -Hearing Test: Not applicable  -Spirometry: Not applicable   PATIENT  COUNSELING:   Advised to take 1 mg of folate supplement per day if capable of pregnancy.   Sexuality: Discussed sexually transmitted diseases, partner selection, use of condoms, avoidance of unintended pregnancy  and contraceptive alternatives.   Advised to avoid cigarette smoking.  I discussed with the patient that most people either abstain from alcohol or drink within safe limits (<=14/week and <=4 drinks/occasion for males, <=7/weeks and <= 3 drinks/occasion for females) and that the risk for alcohol disorders and other health effects rises proportionally with the number of drinks per week and how often a drinker exceeds daily limits.  Discussed cessation/primary prevention of drug use and availability of treatment for abuse.   Diet: Encouraged to adjust caloric intake to maintain  or achieve ideal body weight, to reduce intake of dietary saturated fat and total fat, to limit sodium intake by avoiding high sodium foods and not adding table salt, and to maintain adequate dietary potassium and calcium preferably from fresh fruits, vegetables, and low-fat dairy products.    stressed the importance of regular exercise  Injury prevention: Discussed safety belts, safety helmets, smoke detector, smoking near bedding or upholstery.   Dental health: Discussed importance of regular tooth brushing, flossing, and dental visits.    NEXT PREVENTATIVE PHYSICAL DUE IN 1 YEAR. Return in about 1 year (around 08/07/2022) for Physical and Fasting labs.

## 2021-08-07 NOTE — Addendum Note (Signed)
Addended by: Jon Billings on: 08/07/2021 11:39 AM   Modules accepted: Orders

## 2021-08-08 LAB — CBC WITH DIFFERENTIAL/PLATELET
Basophils Absolute: 0.1 10*3/uL (ref 0.0–0.2)
Basos: 1 %
EOS (ABSOLUTE): 0.1 10*3/uL (ref 0.0–0.4)
Eos: 2 %
Hematocrit: 40.5 % (ref 34.0–46.6)
Hemoglobin: 13.5 g/dL (ref 11.1–15.9)
Immature Grans (Abs): 0 10*3/uL (ref 0.0–0.1)
Immature Granulocytes: 0 %
Lymphocytes Absolute: 1.4 10*3/uL (ref 0.7–3.1)
Lymphs: 23 %
MCH: 31.2 pg (ref 26.6–33.0)
MCHC: 33.3 g/dL (ref 31.5–35.7)
MCV: 94 fL (ref 79–97)
Monocytes Absolute: 0.3 10*3/uL (ref 0.1–0.9)
Monocytes: 6 %
Neutrophils Absolute: 4.3 10*3/uL (ref 1.4–7.0)
Neutrophils: 68 %
Platelets: 183 10*3/uL (ref 150–450)
RBC: 4.33 x10E6/uL (ref 3.77–5.28)
RDW: 11.7 % (ref 11.7–15.4)
WBC: 6.2 10*3/uL (ref 3.4–10.8)

## 2021-08-08 LAB — COMPREHENSIVE METABOLIC PANEL
ALT: 10 IU/L (ref 0–32)
AST: 14 IU/L (ref 0–40)
Albumin/Globulin Ratio: 2 (ref 1.2–2.2)
Albumin: 4.9 g/dL — ABNORMAL HIGH (ref 3.8–4.8)
Alkaline Phosphatase: 54 IU/L (ref 44–121)
BUN/Creatinine Ratio: 19 (ref 9–23)
BUN: 14 mg/dL (ref 6–20)
Bilirubin Total: 0.8 mg/dL (ref 0.0–1.2)
CO2: 22 mmol/L (ref 20–29)
Calcium: 9.6 mg/dL (ref 8.7–10.2)
Chloride: 102 mmol/L (ref 96–106)
Creatinine, Ser: 0.75 mg/dL (ref 0.57–1.00)
Globulin, Total: 2.4 g/dL (ref 1.5–4.5)
Glucose: 87 mg/dL (ref 70–99)
Potassium: 3.9 mmol/L (ref 3.5–5.2)
Sodium: 139 mmol/L (ref 134–144)
Total Protein: 7.3 g/dL (ref 6.0–8.5)
eGFR: 108 mL/min/{1.73_m2} (ref >59)

## 2021-08-08 LAB — LIPID PANEL
Chol/HDL Ratio: 2.7 ratio (ref 0.0–4.4)
Cholesterol, Total: 141 mg/dL (ref 100–199)
HDL: 53 mg/dL (ref >39)
LDL Chol Calc (NIH): 77 mg/dL (ref 0–99)
Triglycerides: 48 mg/dL (ref 0–149)
VLDL Cholesterol Cal: 11 mg/dL (ref 5–40)

## 2021-08-08 LAB — TSH: TSH: 1.79 u[IU]/mL (ref 0.450–4.500)

## 2021-08-08 LAB — HIV ANTIBODY (ROUTINE TESTING W REFLEX): HIV Screen 4th Generation wRfx: NONREACTIVE

## 2021-08-08 LAB — HEPATITIS C ANTIBODY: Hep C Virus Ab: <0.1 s/co ratio (ref 0.0–0.9)

## 2021-08-08 NOTE — Progress Notes (Signed)
Hi Sherry Park.  It was nice to meet you yesterday.  Overall your labs look good.  Your urine sample was slightly abnormal.  I have sent it for culture to ensure there is no bacteria growing.  Please let me know if you have any questions.  Once the culture comes back I will let you know the results.

## 2021-08-12 LAB — URINE CULTURE

## 2021-08-13 NOTE — Progress Notes (Signed)
Hi Sherry Park.  Your urine culture was normal.  No need for further treatment.  Follow up as discussed.

## 2021-09-16 ENCOUNTER — Other Ambulatory Visit: Payer: Self-pay | Admitting: Gastroenterology

## 2021-09-23 NOTE — Progress Notes (Signed)
BP 119/73    Pulse (!) 103    Temp 98.4 F (36.9 C) (Oral)    Wt 135 lb 3.2 oz (61.3 kg)    SpO2 98%    BMI 23.95 kg/m    Subjective:    Patient ID: Sherry Park, female    DOB: 1986-08-09, 35 y.o.   MRN: [id number]  HPI: Sherry Park is a 35 y.o. female  Chief Complaint  Patient presents with   Ear Pain   EAR PAIN Duration: weeks Involved ear(s): right Severity:  7/10  Quality:  sharp Fever: no Otorrhea: no Upper respiratory infection symptoms: no Pruritus: no Hearing loss: no Water immersion no Using Q-tips: no Recurrent otitis media: no Status: stable Treatments attempted: none  Relevant past medical, surgical, family and social history reviewed and updated as indicated. Interim medical history since our last visit reviewed. Allergies and medications reviewed and updated.  Review of Systems  Constitutional:  Negative for fever.  HENT:  Positive for ear pain. Negative for congestion.    Per HPI unless specifically indicated above     Objective:    BP 119/73    Pulse (!) 103    Temp 98.4 F (36.9 C) (Oral)    Wt 135 lb 3.2 oz (61.3 kg)    SpO2 98%    BMI 23.95 kg/m   Wt Readings from Last 3 Encounters:  09/24/21 135 lb 3.2 oz (61.3 kg)  08/07/21 134 lb (60.8 kg)  07/10/21 133 lb 6.4 oz (60.5 kg)    Physical Exam Vitals and nursing note reviewed.  Constitutional:      General: She is not in acute distress.    Appearance: Normal appearance. She is normal weight. She is not ill-appearing, toxic-appearing or diaphoretic.  HENT:     Head: Normocephalic.     Right Ear: External ear normal. There is no impacted cerumen. Tympanic membrane is erythematous. Tympanic membrane is not perforated.     Left Ear: Tympanic membrane and external ear normal. There is no impacted cerumen. Tympanic membrane is not perforated or erythematous.     Nose: Nose normal.     Mouth/Throat:     Mouth: Mucous membranes are moist.     Pharynx: Oropharynx is clear.  Eyes:      General:        Right eye: No discharge.        Left eye: No discharge.     Extraocular Movements: Extraocular movements intact.     Conjunctiva/sclera: Conjunctivae normal.     Pupils: Pupils are equal, round, and reactive to light.  Cardiovascular:     Rate and Rhythm: Normal rate and regular rhythm.     Heart sounds: No murmur heard. Pulmonary:     Effort: Pulmonary effort is normal. No respiratory distress.     Breath sounds: Normal breath sounds. No wheezing or rales.  Musculoskeletal:     Cervical back: Normal range of motion and neck supple.  Skin:    General: Skin is warm and dry.     Capillary Refill: Capillary refill takes less than 2 seconds.  Neurological:     General: No focal deficit present.     Mental Status: She is alert and oriented to person, place, and time. Mental status is at baseline.  Psychiatric:        Mood and Affect: Mood normal.        Behavior: Behavior normal.        Thought Content: Thought  content normal.        Judgment: Judgment normal.    Results for orders placed or performed in visit on 08/07/21  HM PAP SMEAR  Result Value Ref Range   HM Pap smear see report scanned into chart       Assessment & Plan:   Problem List Items Addressed This Visit   None Visit Diagnoses     Acute otitis media, unspecified otitis media type    -  Primary   Amoxicillin and Medrol dose pak sent to the pharmacy. Complete course of medication. Recommend zyrtec for middle ear effeusion. FU if symptoms do not improve.   Relevant Medications   amoxicillin (AMOXIL) 500 MG capsule        Follow up plan: Return if symptoms worsen or fail to improve.

## 2021-09-24 ENCOUNTER — Encounter: Payer: Self-pay | Admitting: Nurse Practitioner

## 2021-09-24 ENCOUNTER — Other Ambulatory Visit: Payer: Self-pay

## 2021-09-24 ENCOUNTER — Ambulatory Visit (INDEPENDENT_AMBULATORY_CARE_PROVIDER_SITE_OTHER): Payer: 59 | Admitting: Nurse Practitioner

## 2021-09-24 VITALS — BP 119/73 | HR 103 | Temp 98.4°F | Wt 135.2 lb

## 2021-09-24 DIAGNOSIS — H6690 Otitis media, unspecified, unspecified ear: Secondary | ICD-10-CM | POA: Diagnosis not present

## 2021-09-24 MED ORDER — METHYLPREDNISOLONE 4 MG PO TBPK: ORAL_TABLET | ORAL | 0 refills | Status: DC

## 2021-09-24 MED ORDER — AMOXICILLIN 500 MG PO CAPS: 500.0000 mg | ORAL_CAPSULE | Freq: Two times a day (BID) | ORAL | 0 refills | Status: AC

## 2022-01-15 DIAGNOSIS — Z3041 Encounter for surveillance of contraceptive pills: Secondary | ICD-10-CM | POA: Diagnosis not present

## 2022-01-29 DIAGNOSIS — D352 Benign neoplasm of pituitary gland: Secondary | ICD-10-CM | POA: Diagnosis not present

## 2022-01-29 DIAGNOSIS — E282 Polycystic ovarian syndrome: Secondary | ICD-10-CM | POA: Diagnosis not present

## 2022-02-05 DIAGNOSIS — D352 Benign neoplasm of pituitary gland: Secondary | ICD-10-CM | POA: Diagnosis not present

## 2022-02-05 DIAGNOSIS — E282 Polycystic ovarian syndrome: Secondary | ICD-10-CM | POA: Diagnosis not present

## 2022-05-28 DIAGNOSIS — F411 Generalized anxiety disorder: Secondary | ICD-10-CM | POA: Diagnosis not present

## 2022-06-18 DIAGNOSIS — F411 Generalized anxiety disorder: Secondary | ICD-10-CM | POA: Diagnosis not present

## 2022-06-25 ENCOUNTER — Telehealth: Payer: Self-pay | Admitting: Gastroenterology

## 2022-06-25 MED ORDER — AMITRIPTYLINE HCL 25 MG PO TABS: ORAL_TABLET | ORAL | 0 refills | Status: DC

## 2022-06-25 MED ORDER — AMITRIPTYLINE HCL 10 MG PO TABS: 10.0000 mg | ORAL_TABLET | Freq: Every day | ORAL | 0 refills | Status: DC

## 2022-06-25 NOTE — Telephone Encounter (Signed)
Sent '25mg'$  to the pharmacy

## 2022-06-25 NOTE — Telephone Encounter (Signed)
Pt is requesting refill on her amitriptyline she has an appointment sched for Jan.

## 2022-07-02 DIAGNOSIS — F411 Generalized anxiety disorder: Secondary | ICD-10-CM | POA: Diagnosis not present

## 2022-07-16 DIAGNOSIS — F411 Generalized anxiety disorder: Secondary | ICD-10-CM | POA: Diagnosis not present

## 2022-07-26 DIAGNOSIS — D352 Benign neoplasm of pituitary gland: Secondary | ICD-10-CM | POA: Diagnosis not present

## 2022-07-26 DIAGNOSIS — E282 Polycystic ovarian syndrome: Secondary | ICD-10-CM | POA: Diagnosis not present

## 2022-08-05 DIAGNOSIS — F411 Generalized anxiety disorder: Secondary | ICD-10-CM | POA: Diagnosis not present

## 2022-08-07 NOTE — Progress Notes (Unsigned)
There were no vitals taken for this visit.   Subjective:    Patient ID: Sherry Park, female    DOB: [birth year], 36 y.o.   MRN: [id number]  HPI: Sherry Park is a 36 y.o. female presenting on 08/08/2022 for comprehensive medical examination. Current medical complaints include:none  She currently lives with: Menopausal Symptoms: no   Denies HA, CP, SOB, dizziness, palpitations, visual changes, and lower extremity swelling.  Depression Screen done today and results listed below:     09/24/2021    9:53 AM 08/07/2021    9:15 AM 05/21/2019    9:02 AM 08/07/2018   11:00 AM 06/10/2018   10:39 AM  Depression screen PHQ 2/9  Decreased Interest 0 0 0 1 1  Down, Depressed, Hopeless 0 0 0 0 1  PHQ - 2 Score 0 0 0 1 2  Altered sleeping 1 0 0 1 2  Tired, decreased energy 0 1 0 1 2  Change in appetite 0 0 0 0 3  Feeling bad or failure about yourself  0 0 0 0 0  Trouble concentrating 0 0 0 0 1  Moving slowly or fidgety/restless 0 0 0 0 0  Suicidal thoughts 0 0 0 0 0  PHQ-9 Score 1 1 0 3 10  Difficult doing work/chores Not difficult at all Not difficult at all Not difficult at all      The patient does not have a history of falls. I did complete a risk assessment for falls. A plan of care for falls was documented.   Past Medical History:  Past Medical History:  Diagnosis Date   Anxiety    Functional dyspepsia    Headache    a. felt to be 2/2 Dostinex   Hemorrhoids    Hyperprolactinemia ([place])    Microprolactinoma ([place])    a. on Dostinex; b. followed by Dr. Gabriel Carina, MD   Palpitations    a. 04/2016 Event Monitor: 1 episode of narrow complex tachycardia @ 163 - likely sinus tach though SVT could not be excluded (happened during "anxiety and panic attack"); b. 04/2016 Echo: EF 60-65%, no rwma. Nl LA size. Nl RV fxn.   PCOS (polycystic ovarian syndrome)     Surgical History:  Past Surgical History:  Procedure Laterality Date   ESOPHAGOGASTRODUODENOSCOPY (EGD) WITH PROPOFOL N/A  09/24/2018   Procedure: ESOPHAGOGASTRODUODENOSCOPY (EGD) WITH PROPOFOL;  Surgeon: Lin Landsman, MD;  Location: [facility] ENDOSCOPY;  Service: Gastroenterology;  Laterality: N/A;   NO PAST SURGERIES     verified with patient- 09/09/17    Medications:  Current Outpatient Medications on File Prior to Visit  Medication Sig   amitriptyline (ELAVIL) 25 MG tablet TAKE 1 TABLET BY MOUTH EVERYDAY AT BEDTIME   cabergoline (DOSTINEX) 0.5 MG tablet Take 0.25 mg by mouth once a week.   ERRIN 0.35 MG tablet Take 1 tablet by mouth daily.   methylPREDNISolone (MEDROL DOSEPAK) 4 MG TBPK tablet Take as directed   Multiple Vitamins-Minerals (MULTIVITAMIN ADULTS) TABS Take by mouth.   SUMAtriptan (IMITREX) 100 MG tablet PLEASE SEE ATTACHED FOR DETAILED DIRECTIONS   No current facility-administered medications on file prior to visit.    Allergies:  Allergies  Allergen Reactions   Doxycycline Other (See Comments)    Thrush and rash Thrush and rash Thrush and rash   Latex Itching   Pantoprazole Other (See Comments)    abdominal pain abdominal pain abdominal pain    Social History:  Social History   Socioeconomic History  Marital status: Single    Spouse name: Not on file   Number of children: Not on file   Years of education: Not on file   Highest education level: Not on file  Occupational History   Not on file  Tobacco Use   Smoking status: Never   Smokeless tobacco: Never  Vaping Use   Vaping Use: Never used  Substance and Sexual Activity   Alcohol use: No   Drug use: No   Sexual activity: Yes    Birth control/protection: Pill  Other Topics Concern   Not on file  Social History Narrative   Not on file   Social Determinants of Health   Financial Resource Strain: Not on file  Food Insecurity: Not on file  Transportation Needs: Not on file  Physical Activity: Not on file  Stress: Not on file  Social Connections: Not on file  Intimate Partner Violence: Not on file    Social History   Tobacco Use  Smoking Status Never  Smokeless Tobacco Never   Social History   Substance and Sexual Activity  Alcohol Use No    Family History:  Family History  Problem Relation Age of Onset   Diabetes Mellitus II Mother    Hypertension Mother    Stroke Mother        disabling stroke from oral contraceptive pill use at 34 years of age, no family hx of CVA   Kidney disease Father    Hypertension Sister    Congestive Heart Failure Maternal Grandmother    Kidney disease Maternal Grandmother    Cancer Paternal Grandfather        skin   Hematuria Neg Hx    Kidney cancer Neg Hx    Prostate cancer Neg Hx    Sickle cell trait Neg Hx    Tuberculosis Neg Hx     Past medical history, surgical history, medications, allergies, family history and social history reviewed with patient today and changes made to appropriate areas of the chart.   Review of Systems  Eyes:  Negative for blurred vision and double vision.  Respiratory:  Negative for shortness of breath.   Cardiovascular:  Negative for chest pain, palpitations and leg swelling.  Neurological:  Negative for dizziness and headaches.   All other ROS negative except what is listed above and in the HPI.      Objective:    There were no vitals taken for this visit.  Wt Readings from Last 3 Encounters:  09/24/21 135 lb 3.2 oz (61.3 kg)  08/07/21 134 lb (60.8 kg)  07/10/21 133 lb 6.4 oz (60.5 kg)    Physical Exam Vitals and nursing note reviewed.  Constitutional:      General: She is awake. She is not in acute distress.    Appearance: She is well-developed. She is not ill-appearing.  HENT:     Head: Normocephalic and atraumatic.     Right Ear: Hearing, tympanic membrane, ear canal and external ear normal. No drainage.     Left Ear: Hearing, tympanic membrane, ear canal and external ear normal. No drainage.     Nose: Nose normal.     Right Sinus: No maxillary sinus tenderness or frontal sinus  tenderness.     Left Sinus: No maxillary sinus tenderness or frontal sinus tenderness.     Mouth/Throat:     Mouth: Mucous membranes are moist.     Pharynx: Oropharynx is clear. Uvula midline. No pharyngeal swelling, oropharyngeal exudate or posterior oropharyngeal erythema.  Eyes:     General: Lids are normal.        Right eye: No discharge.        Left eye: No discharge.     Extraocular Movements: Extraocular movements intact.     Conjunctiva/sclera: Conjunctivae normal.     Pupils: Pupils are equal, round, and reactive to light.     Visual Fields: Right eye visual fields normal and left eye visual fields normal.  Neck:     Thyroid: No thyromegaly.     Vascular: No carotid bruit.     Trachea: Trachea normal.  Cardiovascular:     Rate and Rhythm: Normal rate and regular rhythm.     Heart sounds: Normal heart sounds. No murmur heard.    No gallop.  Pulmonary:     Effort: Pulmonary effort is normal. No accessory muscle usage or respiratory distress.     Breath sounds: Normal breath sounds.  Chest:  Breasts:    Right: Normal.     Left: Normal.  Abdominal:     General: Bowel sounds are normal.     Palpations: Abdomen is soft. There is no hepatomegaly or splenomegaly.     Tenderness: There is no abdominal tenderness.  Musculoskeletal:        General: Normal range of motion.     Cervical back: Normal range of motion and neck supple.     Right lower leg: No edema.     Left lower leg: No edema.  Lymphadenopathy:     Head:     Right side of head: No submental, submandibular, tonsillar, preauricular or posterior auricular adenopathy.     Left side of head: No submental, submandibular, tonsillar, preauricular or posterior auricular adenopathy.     Cervical: No cervical adenopathy.     Upper Body:     Right upper body: No supraclavicular, axillary or pectoral adenopathy.     Left upper body: No supraclavicular, axillary or pectoral adenopathy.  Skin:    General: Skin is warm and  dry.     Capillary Refill: Capillary refill takes less than 2 seconds.     Findings: No rash.  Neurological:     Mental Status: She is alert and oriented to person, place, and time.     Gait: Gait is intact.     Deep Tendon Reflexes: Reflexes are normal and symmetric.     Reflex Scores:      Brachioradialis reflexes are 2+ on the right side and 2+ on the left side.      Patellar reflexes are 2+ on the right side and 2+ on the left side. Psychiatric:        Attention and Perception: Attention normal.        Mood and Affect: Mood normal.        Speech: Speech normal.        Behavior: Behavior normal. Behavior is cooperative.        Thought Content: Thought content normal.        Judgment: Judgment normal.     Results for orders placed or performed in visit on 08/07/21  HM PAP SMEAR  Result Value Ref Range   HM Pap smear see report scanned into chart       Assessment & Plan:   Problem List Items Addressed This Visit   None    Follow up plan: No follow-ups on file.   LABORATORY TESTING:  - Pap smear: up to date  IMMUNIZATIONS:   - Tdap: Tetanus  vaccination status reviewed: last tetanus booster within 10 years. - Influenza: Administered today - Pneumovax: Not applicable - Prevnar: Not applicable - HPV: Up to date - Zostavax vaccine: Not applicable  SCREENING: -Mammogram: Not applicable  - Colonoscopy: Not applicable  - Bone Density: Not applicable  -Hearing Test: Not applicable  -Spirometry: Not applicable   PATIENT COUNSELING:   Advised to take 1 mg of folate supplement per day if capable of pregnancy.   Sexuality: Discussed sexually transmitted diseases, partner selection, use of condoms, avoidance of unintended pregnancy  and contraceptive alternatives.   Advised to avoid cigarette smoking.  I discussed with the patient that most people either abstain from alcohol or drink within safe limits (<=14/week and <=4 drinks/occasion for males, <=7/weeks and <= 3  drinks/occasion for females) and that the risk for alcohol disorders and other health effects rises proportionally with the number of drinks per week and how often a drinker exceeds daily limits.  Discussed cessation/primary prevention of drug use and availability of treatment for abuse.   Diet: Encouraged to adjust caloric intake to maintain  or achieve ideal body weight, to reduce intake of dietary saturated fat and total fat, to limit sodium intake by avoiding high sodium foods and not adding table salt, and to maintain adequate dietary potassium and calcium preferably from fresh fruits, vegetables, and low-fat dairy products.    stressed the importance of regular exercise  Injury prevention: Discussed safety belts, safety helmets, smoke detector, smoking near bedding or upholstery.   Dental health: Discussed importance of regular tooth brushing, flossing, and dental visits.    NEXT PREVENTATIVE PHYSICAL DUE IN 1 YEAR. No follow-ups on file.

## 2022-08-08 ENCOUNTER — Ambulatory Visit (INDEPENDENT_AMBULATORY_CARE_PROVIDER_SITE_OTHER): Payer: 59 | Admitting: Nurse Practitioner

## 2022-08-08 ENCOUNTER — Encounter: Payer: Self-pay | Admitting: Nurse Practitioner

## 2022-08-08 VITALS — BP 132/81 | HR 93 | Temp 97.5°F | Ht 64.27 in | Wt 139.1 lb

## 2022-08-08 DIAGNOSIS — Z0000 Encounter for general adult medical examination without abnormal findings: Secondary | ICD-10-CM

## 2022-08-08 DIAGNOSIS — Z136 Encounter for screening for cardiovascular disorders: Secondary | ICD-10-CM

## 2022-08-08 DIAGNOSIS — Z23 Encounter for immunization: Secondary | ICD-10-CM

## 2022-08-08 LAB — URINALYSIS, ROUTINE W REFLEX MICROSCOPIC
Bilirubin, UA: NEGATIVE
Glucose, UA: NEGATIVE
Ketones, UA: NEGATIVE
Leukocytes,UA: NEGATIVE
Nitrite, UA: NEGATIVE
Specific Gravity, UA: 1.025 (ref 1.005–1.030)
Urobilinogen, Ur: 0.2 mg/dL (ref 0.2–1.0)
pH, UA: 6.5 (ref 5.0–7.5)

## 2022-08-08 LAB — MICROSCOPIC EXAMINATION: Bacteria, UA: NONE SEEN

## 2022-08-09 LAB — LIPID PANEL
Chol/HDL Ratio: 3.2 ratio (ref 0.0–4.4)
Cholesterol, Total: 146 mg/dL (ref 100–199)
HDL: 45 mg/dL (ref >39)
LDL Chol Calc (NIH): 86 mg/dL (ref 0–99)
Triglycerides: 76 mg/dL (ref 0–149)
VLDL Cholesterol Cal: 15 mg/dL (ref 5–40)

## 2022-08-09 LAB — COMPREHENSIVE METABOLIC PANEL
ALT: 9 IU/L (ref 0–32)
AST: 16 IU/L (ref 0–40)
Albumin/Globulin Ratio: 1.7 (ref 1.2–2.2)
Albumin: 4.6 g/dL (ref 3.9–4.9)
Alkaline Phosphatase: 67 IU/L (ref 44–121)
BUN/Creatinine Ratio: 13 (ref 9–23)
BUN: 10 mg/dL (ref 6–20)
Bilirubin Total: 1.2 mg/dL (ref 0.0–1.2)
CO2: 24 mmol/L (ref 20–29)
Calcium: 9.3 mg/dL (ref 8.7–10.2)
Chloride: 101 mmol/L (ref 96–106)
Creatinine, Ser: 0.8 mg/dL (ref 0.57–1.00)
Globulin, Total: 2.7 g/dL (ref 1.5–4.5)
Glucose: 86 mg/dL (ref 70–99)
Potassium: 3.6 mmol/L (ref 3.5–5.2)
Sodium: 138 mmol/L (ref 134–144)
Total Protein: 7.3 g/dL (ref 6.0–8.5)
eGFR: 99 mL/min/{1.73_m2} (ref >59)

## 2022-08-09 LAB — CBC WITH DIFFERENTIAL/PLATELET
Basophils Absolute: 0.1 10*3/uL (ref 0.0–0.2)
Basos: 1 %
EOS (ABSOLUTE): 0.1 10*3/uL (ref 0.0–0.4)
Eos: 3 %
Hematocrit: 42.1 % (ref 34.0–46.6)
Hemoglobin: 13.7 g/dL (ref 11.1–15.9)
Immature Grans (Abs): 0 10*3/uL (ref 0.0–0.1)
Immature Granulocytes: 0 %
Lymphocytes Absolute: 1.3 10*3/uL (ref 0.7–3.1)
Lymphs: 26 %
MCH: 30.7 pg (ref 26.6–33.0)
MCHC: 32.5 g/dL (ref 31.5–35.7)
MCV: 94 fL (ref 79–97)
Monocytes Absolute: 0.4 10*3/uL (ref 0.1–0.9)
Monocytes: 8 %
Neutrophils Absolute: 3 10*3/uL (ref 1.4–7.0)
Neutrophils: 62 %
Platelets: 186 10*3/uL (ref 150–450)
RBC: 4.46 x10E6/uL (ref 3.77–5.28)
RDW: 12.2 % (ref 11.7–15.4)
WBC: 4.8 10*3/uL (ref 3.4–10.8)

## 2022-08-09 LAB — TSH: TSH: 0.875 u[IU]/mL (ref 0.450–4.500)

## 2022-08-09 NOTE — Progress Notes (Signed)
Hi Haly. It was nice to see you yesterday.  Your lab work looks good.  No concerns at this time. Continue with your current medication regimen.  Follow up as discussed.  Please let me know if you have any questions.

## 2022-08-19 DIAGNOSIS — E282 Polycystic ovarian syndrome: Secondary | ICD-10-CM | POA: Diagnosis not present

## 2022-08-19 DIAGNOSIS — D352 Benign neoplasm of pituitary gland: Secondary | ICD-10-CM | POA: Diagnosis not present

## 2022-08-20 DIAGNOSIS — F411 Generalized anxiety disorder: Secondary | ICD-10-CM | POA: Diagnosis not present

## 2022-09-03 DIAGNOSIS — F411 Generalized anxiety disorder: Secondary | ICD-10-CM | POA: Diagnosis not present

## 2022-09-18 DIAGNOSIS — Z113 Encounter for screening for infections with a predominantly sexual mode of transmission: Secondary | ICD-10-CM | POA: Diagnosis not present

## 2022-09-18 DIAGNOSIS — Z1331 Encounter for screening for depression: Secondary | ICD-10-CM | POA: Diagnosis not present

## 2022-09-18 DIAGNOSIS — R102 Pelvic and perineal pain: Secondary | ICD-10-CM | POA: Diagnosis not present

## 2022-09-18 DIAGNOSIS — Z01411 Encounter for gynecological examination (general) (routine) with abnormal findings: Secondary | ICD-10-CM | POA: Diagnosis not present

## 2022-09-19 DIAGNOSIS — F411 Generalized anxiety disorder: Secondary | ICD-10-CM | POA: Diagnosis not present

## 2022-09-20 ENCOUNTER — Other Ambulatory Visit: Payer: Self-pay | Admitting: Gastroenterology

## 2022-10-08 DIAGNOSIS — F411 Generalized anxiety disorder: Secondary | ICD-10-CM | POA: Diagnosis not present

## 2022-10-15 ENCOUNTER — Other Ambulatory Visit: Payer: Self-pay | Admitting: Gastroenterology

## 2022-10-21 DIAGNOSIS — F411 Generalized anxiety disorder: Secondary | ICD-10-CM | POA: Diagnosis not present

## 2022-11-05 ENCOUNTER — Encounter: Payer: Self-pay | Admitting: Gastroenterology

## 2022-11-05 ENCOUNTER — Ambulatory Visit (INDEPENDENT_AMBULATORY_CARE_PROVIDER_SITE_OTHER): Payer: 59 | Admitting: Gastroenterology

## 2022-11-05 VITALS — BP 132/78 | HR 97 | Temp 97.8°F | Ht 64.27 in | Wt 131.5 lb

## 2022-11-05 DIAGNOSIS — K30 Functional dyspepsia: Secondary | ICD-10-CM

## 2022-11-05 DIAGNOSIS — F411 Generalized anxiety disorder: Secondary | ICD-10-CM | POA: Diagnosis not present

## 2022-11-05 MED ORDER — AMITRIPTYLINE HCL 25 MG PO TABS: 25.0000 mg | ORAL_TABLET | Freq: Every day | ORAL | 3 refills | Status: DC

## 2022-11-05 NOTE — Progress Notes (Signed)
Cephas Darby, MD [address]  [place]  [place], [place] [address]  Main: [phone]  Fax: [phone]    Gastroenterology Consultation  Referring Provider:     Jon Billings, NP Primary Care Physician:  Jon Billings, NP Primary Gastroenterologist:  Dr. Cephas Darby Reason for Consultation:   Functional dyspepsia        HPI:   Sherry Park is a 37 y.o. female referred by Jon Billings, NP  for consultation & management of functional dyspepsia.  Patient has been experiencing nausea, early satiety, bloating, burping since July 2019, got worse in August 2019.  Due to persistent symptoms, she was empirically treated for H. pylori infection with triple therapy based on positive serologies.  Her symptoms improved but continued to have early satiety associated with some bloating and burping.  She underwent gastric emptying study which was normal.  EGD was unremarkable.  I switched her from buspirone to amitriptyline 25 mg at bedtime 1 year ago.  Patient reports that her symptoms are significantly improved and has also helped to gain weight.  She does notice intermittent burping.  She is no longer experiencing early satiety and abdominal bloating.  Follow-up visit 11/05/2022 Patient is here for follow-up of functional dyspepsia.  She has been doing well on amitriptyline 12.5 mg daily.  She is here to renew her prescription, last visit was in 2022.  Patient is trying to lose weight by following healthy diet and exercise.  She does not have any GI concerns today.  Whenever she has flareup, increase his amitriptyline to 25 mg which helps  NSAIDs: None  Antiplts/Anticoagulants/Anti thrombotics: None  GI Procedures:   Nuclear medicine gastric emptying study 08/27/2020 Normal  EGD 09/24/2018 - Normal duodenal bulb and second portion of the duodenum. Biopsied. - A few gastric polyps. - Normal stomach. Biopsied. - Normal gastroesophageal junction and  esophagus. DIAGNOSIS:  A. DUODENUM; COLD BIOPSY:  - ENTERIC MUCOSA WITH PRESERVED VILLOUS ARCHITECTURE AND NO SIGNIFICANT  HISTOPATHOLOGIC CHANGE.  - NEGATIVE FOR FEATURES OF CELIAC, DYSPLASIA, AND MALIGNANCY.   B. STOMACH, RANDOM; COLD BIOPSY:  - OXYNTIC MUCOSA WITH MINIMAL CHRONIC GASTRITIS AND OXYNTIC GLAND  HYPERPLASIA, SEE NOTE.  - NEGATIVE FOR H. PYLORI, DYSPLASIA AND MALIGNANCY.  She denies family history of GI malignancy She does not smoke or drink alcohol Denies any GI surgeries  Past Medical History:  Diagnosis Date   Anxiety    Functional dyspepsia    Headache    a. felt to be 2/2 Dostinex   Hemorrhoids    Hyperprolactinemia ([place])    Microprolactinoma ([place])    a. on Dostinex; b. followed by Dr. Gabriel Carina, MD   Palpitations    a. 04/2016 Event Monitor: 1 episode of narrow complex tachycardia @ 163 - likely sinus tach though SVT could not be excluded (happened during "anxiety and panic attack"); b. 04/2016 Echo: EF 60-65%, no rwma. Nl LA size. Nl RV fxn.   PCOS (polycystic ovarian syndrome)     Past Surgical History:  Procedure Laterality Date   ESOPHAGOGASTRODUODENOSCOPY (EGD) WITH PROPOFOL N/A 09/24/2018   Procedure: ESOPHAGOGASTRODUODENOSCOPY (EGD) WITH PROPOFOL;  Surgeon: Lin Landsman, MD;  Location: [facility] ENDOSCOPY;  Service: Gastroenterology;  Laterality: N/A;   NO PAST SURGERIES     verified with patient- 09/09/17    Current Outpatient Medications:    amitriptyline (ELAVIL) 25 MG tablet, TAKE 1 TABLET BY MOUTH EVERYDAY AT BEDTIME, Disp: 90 tablet, Rfl: 1   cabergoline (DOSTINEX) 0.5 MG tablet,  Take 0.25 mg by mouth once a week., Disp: , Rfl:    ERRIN 0.35 MG tablet, Take 1 tablet by mouth daily., Disp: , Rfl: 3   Multiple Vitamins-Minerals (MULTIVITAMIN ADULTS) TABS, Take by mouth., Disp: , Rfl:    Family History  Problem Relation Age of Onset   Diabetes Mellitus II Mother    Hypertension Mother    Stroke Mother        disabling stroke from oral  contraceptive pill use at 35 years of age, no family hx of CVA   Kidney disease Father    Hypertension Sister    Congestive Heart Failure Maternal Grandmother    Kidney disease Maternal Grandmother    Cancer Paternal Grandfather        skin   Hematuria Neg Hx    Kidney cancer Neg Hx    Prostate cancer Neg Hx    Sickle cell trait Neg Hx    Tuberculosis Neg Hx      Social History   Tobacco Use   Smoking status: Never   Smokeless tobacco: Never  Vaping Use   Vaping Use: Never used  Substance Use Topics   Alcohol use: No   Drug use: No    Allergies as of 11/05/2022 - Review Complete 11/05/2022  Allergen Reaction Noted   Doxycycline Other (See Comments) 06/03/2017   Latex Itching 07/01/2016   Pantoprazole Other (See Comments) 07/10/2018    Review of Systems:    All systems reviewed and negative except where noted in HPI.   Physical Exam:  BP 132/78 (BP Location: Left Arm, Patient Position: Sitting, Cuff Size: Normal)   Pulse 97   Temp 97.8 F (36.6 C) (Oral)   Ht 5' 4.27" (1.632 m)   Wt 131 lb 8 oz (59.6 kg)   BMI 22.38 kg/m  No LMP recorded.  General:   Alert,  Well-developed, well-nourished, pleasant and cooperative in NAD Head:  Normocephalic and atraumatic. Eyes:  Sclera clear, no icterus.   Conjunctiva pink. Ears:  Normal auditory acuity. Nose:  No deformity, discharge, or lesions. Mouth:  No deformity or lesions,oropharynx pink & moist. Neck:  Supple; no masses or thyromegaly. Lungs:  Respirations even and unlabored.  Clear throughout to auscultation.   No wheezes, crackles, or rhonchi. No acute distress. Heart:  Regular rate and rhythm; no murmurs, clicks, rubs, or gallops. Abdomen:  Normal bowel sounds. Soft, non-tender and non-distended without masses, hepatosplenomegaly or hernias noted.  No guarding or rebound tenderness.   Rectal: Not performed Msk:  Symmetrical without gross deformities. Good, equal movement & strength bilaterally. Pulses:  Normal  pulses noted. Extremities:  No clubbing or edema.  No cyanosis. Neurologic:  Alert and oriented x3;  grossly normal neurologically. Skin:  Intact without significant lesions or rashes. No jaundice. Psych:  Alert and cooperative. Normal mood and affect.  Imaging Studies: Reviewed  Assessment and Plan:   Sherry Park is a 37 y.o. Caucasian female with symptoms consistent with chronic nonulcer dyspepsia empirically treated with triple therapy for positive H. pylori serologies, modestly improved associated with significant weight loss. EGD with gastric and duodenal biopsies unremarkable, alpha gal panel, food allergy profile unremarkable, ferritin, iron and TIBC, total IgA and folate levels normal.  Patient was evaluated by general surgery who recommended HIDA scan to evaluate for biliary dyskinesia.  Patient deferred this test at that time as she figured out food triggers causing her symptoms.  She probably has functional dyspepsia.  She cannot tolerate carbohydrates or any  sugars.  Her main symptoms are postprandial abdominal bloating and nausea.  She also tried 2 weeks course of rifaximin with no benefit.  Low FODMAP diet did not help.  Desipramine did not help.  She tried buspirone which did not help as well.  Amitriptyline 25 mg at bedtime significantly helped with her symptoms of dyspepsia.  Currently, she is taking amitriptyline 12.5 mg at bedtime regularly and 25 mg during flareups New prescription sent   Follow up as needed, contact via my chart as needed   Cephas Darby, MD

## 2022-11-24 ENCOUNTER — Emergency Department: Payer: 59

## 2022-11-24 ENCOUNTER — Encounter: Payer: Self-pay | Admitting: Emergency Medicine

## 2022-11-24 ENCOUNTER — Other Ambulatory Visit: Payer: Self-pay

## 2022-11-24 ENCOUNTER — Emergency Department
Admission: EM | Admit: 2022-11-24 | Discharge: 2022-11-24 | Disposition: A | Discharge status: Home / Self Care | Payer: 59 | Attending: Emergency Medicine | Admitting: Emergency Medicine

## 2022-11-24 DIAGNOSIS — W19XXXA Unspecified fall, initial encounter: Secondary | ICD-10-CM

## 2022-11-24 DIAGNOSIS — S0003XA Contusion of scalp, initial encounter: Secondary | ICD-10-CM | POA: Diagnosis not present

## 2022-11-24 DIAGNOSIS — R6884 Jaw pain: Secondary | ICD-10-CM | POA: Insufficient documentation

## 2022-11-24 DIAGNOSIS — S0993XA Unspecified injury of face, initial encounter: Secondary | ICD-10-CM | POA: Diagnosis not present

## 2022-11-24 DIAGNOSIS — W010XXA Fall on same level from slipping, tripping and stumbling without subsequent striking against object, initial encounter: Secondary | ICD-10-CM | POA: Insufficient documentation

## 2022-11-24 DIAGNOSIS — S0990XA Unspecified injury of head, initial encounter: Secondary | ICD-10-CM

## 2022-11-24 MED ORDER — ACETAMINOPHEN 325 MG PO TABS
650.0000 mg | ORAL_TABLET | Freq: Once | ORAL | Status: AC
  Administered 2022-11-24: 650 mg via ORAL
  Filled 2022-11-24: qty 2

## 2022-11-24 NOTE — ED Triage Notes (Signed)
Pt had mechanical fall on hardwoods today and hit her head. Denies blood thinners or LOC. Pt has swelling to back of head.

## 2022-11-24 NOTE — ED Provider Notes (Signed)
[facility] Provider Note    Event Date/Time   First MD Initiated Contact with Patient 11/24/22 1151     (approximate)   History   Fall   HPI  Sherry Park is a 38 y.o. female who presents today for evaluation after a trip and fall.  Patient reports that she slid on wooden ground, hit the back of her head.  She reports that she has now had clicking in her left jaw since this happened.  She did not lose consciousness.  There has been no nausea or vomiting.  She denies visual changes.  She denies any trouble opening her mouth or closing her mouth.  She reports that she has TMJ in her left side which is the area that is hurting her.  She is not on anticoagulation.  She denies neck pain, numbness, tingling, or weakness.  She has been able to ambulate without difficulty.  Patient Active Problem List   Diagnosis Date Noted   Bilateral ovarian cysts 08/02/2019   Functional dyspepsia 08/02/2019   Dyspepsia    Complex ovarian cyst 07/29/2018   History of ovarian cyst 07/29/2018   Pelvic pain in female 07/29/2018   Tachycardia    White coat syndrome with hypertension 05/19/2017   Mastalgia 07/10/2016   Generalized anxiety disorder 06/06/2016   Microprolactinoma ([place]) 04/05/2014   Oligomenorrhea 04/05/2014          Physical Exam   Triage Vital Signs: ED Triage Vitals  Enc Vitals Group     BP 11/24/22 1142 (!) 133/107     Pulse Rate 11/24/22 1142 100     Resp 11/24/22 1142 18     Temp 11/24/22 1142 (!) 97.3 F (36.3 C)     Temp Source 11/24/22 1142 Oral     SpO2 11/24/22 1142 99 %     Weight 11/24/22 1141 130 lb (59 kg)     Height 11/24/22 1141 5' 3"$  (1.6 m)     Head Circumference --      Peak Flow --      Pain Score 11/24/22 1141 5     Pain Loc --      Pain Edu? --      Excl. in [place]? --     Most recent vital signs: Vitals:   11/24/22 1142  BP: (!) 133/107  Pulse: 100  Resp: 18  Temp: (!) 97.3 F (36.3 C)  SpO2: 99%    Physical  Exam Vitals and nursing note reviewed.  Constitutional:      General: Awake and alert. No acute distress.    Appearance: Normal appearance. The patient is normal weight.  HENT:     Head: Normocephalic and atraumatic.  2 x 2 centimeter hematoma to back of head, no open wounds.  Negative Battle sign or raccoon eyes. Negative tongue blade test.  No trismus.  No dental fracture noted.  No swelling to her face.    Mouth: Mucous membranes are moist.  Eyes:     General: PERRL. Normal EOMs        Right eye: No discharge.        Left eye: No discharge.     Conjunctiva/sclera: Conjunctivae normal.  Cardiovascular:     Rate and Rhythm: Normal rate and regular rhythm.     Pulses: Normal pulses.  Pulmonary:     Effort: Pulmonary effort is normal. No respiratory distress.     Breath sounds: Normal breath sounds.  Abdominal:  Abdomen is soft. There is no abdominal tenderness. No rebound or guarding. No distention. Musculoskeletal:        General: No swelling. Normal range of motion.     Cervical back: Normal range of motion and neck supple.  Uvula midline.  No tonsillar exudate.  No soft palate fluctuance.  No trismus.  No voice change.  No sublingual swelling.  No tender cervical lymphadenopathy.  No nuchal rigidity Skin:    General: Skin is warm and dry.     Capillary Refill: Capillary refill takes less than 2 seconds.     Findings: No rash.  Neurological:     Mental Status: The patient is awake and alert.   Neurological: GCS 15 alert and oriented x3 Normal speech, no expressive or receptive aphasia or dysarthria Cranial nerves II through XII intact Normal visual fields 5 out of 5 strength in all 4 extremities with intact sensation throughout No extremity drift Normal finger-to-nose testing, no limb or truncal ataxia    ED Results / Procedures / Treatments   Labs (all labs ordered are listed, but only abnormal results are displayed) Labs Reviewed - No data to  display   EKG     RADIOLOGY I independently reviewed and interpreted imaging and agree with radiologists findings.     PROCEDURES:  Critical Care performed:   Procedures   MEDICATIONS ORDERED IN ED: Medications  acetaminophen (TYLENOL) tablet 650 mg (650 mg Oral Given 11/24/22 1234)     IMPRESSION / MDM / ASSESSMENT AND PLAN / ED COURSE  I reviewed the triage vital signs and the nursing notes.   Differential diagnosis includes, but is not limited to, fracture, TMJ, dislocation.  Patient is awake and alert, hemodynamically stable and neurovascular intact.  There is no LOC, no vomiting, no focal neurological deficit, no anticoagulation, no midline tenderness to her cervical spine, and she has full range of motion of her neck without pain with normal strength and sensation bilateral upper extremities and normal grip strength, does not meet criteria for CT head or neck per [place] criteria.  Discussed this with patient and her mother who agreed with this.  She was treated with Tylenol with improvement of her symptoms.  CT face was obtained given her subjective malocclusion, though she had a negative tongue blade test, low suspicion for jaw fracture.  CT face was negative for any acute bony injury.  Recommend that she eat soft foods and apply cool compresses to this area.  Recommended close outpatient follow-up.  Patient understands and agrees with plan.  She was discharged in stable condition.   Patient's presentation is most consistent with acute complicated illness / injury requiring diagnostic workup.    FINAL CLINICAL IMPRESSION(S) / ED DIAGNOSES   Final diagnoses:  Fall, initial encounter  Jaw pain  Injury of head, initial encounter  Scalp hematoma, initial encounter     Rx / DC Orders   ED Discharge Orders     None        Note:  This document was prepared using Dragon voice recognition software and may include unintentional dictation errors.   Emeline Gins 11/24/22 1453    Vanessa [place], MD 11/25/22 1600

## 2022-11-24 NOTE — Discharge Instructions (Signed)
Continue to apply ice to the area.  Please follow-up with your outpatient provider if your symptoms do not improve.  Please return to the emergency department for any new, worsening, or changing symptoms or other concerns.  It was a pleasure caring for you today.

## 2022-11-25 ENCOUNTER — Telehealth: Payer: Self-pay

## 2022-11-25 NOTE — Transitions of Care (Post Inpatient/ED Visit) (Signed)
   11/25/2022  Name: Sherry Park MRN: [id number] DOB: 1986/09/10  Today's TOC FU Call Status: Today's TOC FU Call Status:: Successful TOC FU Call Competed TOC FU Call Complete Date: 11/25/22  Transition Care Management Follow-up Telephone Call Date of Discharge: 11/24/22 Discharge Facility: [facility]) Type of Discharge: Emergency Department How have you been since you were released from the hospital?: Better Any questions or concerns?: No  Items Reviewed: Did you receive and understand the discharge instructions provided?: No Medications obtained and verified?: Yes (Medications Reviewed) Any new allergies since your discharge?: No Dietary orders reviewed?: NA Do you have support at home?: Yes People in Home: parent(s)  Home Care and Equipment/Supplies: [place] Ordered?: NA Any new equipment or medical supplies ordered?: NA  Functional Questionnaire: Do you need assistance with bathing/showering or dressing?: No Do you need assistance with meal preparation?: No Do you need assistance with eating?: No Do you have difficulty maintaining continence: No Do you need assistance with getting out of bed/getting out of a chair/moving?: No Do you have difficulty managing or taking your medications?: No  Folllow up appointments reviewed: PCP Follow-up appointment confirmed?: Yes Date of PCP follow-up appointment?: 11/28/22 Follow-up Provider: Jon Billings, NP [facility] Follow-up appointment confirmed?: NA Do you need transportation to your follow-up appointment?: No Do you understand care options if your condition(s) worsen?: Yes-patient verbalized understanding    SIGNATURE: Yvonna Alanis, [place]

## 2022-11-28 ENCOUNTER — Ambulatory Visit: Payer: 59 | Admitting: Nurse Practitioner

## 2022-11-28 ENCOUNTER — Encounter: Payer: Self-pay | Admitting: Nurse Practitioner

## 2022-11-28 VITALS — BP 112/72 | HR 98 | Temp 98.5°F | Wt 130.2 lb

## 2022-11-28 DIAGNOSIS — N930 Postcoital and contact bleeding: Secondary | ICD-10-CM

## 2022-11-28 DIAGNOSIS — Z113 Encounter for screening for infections with a predominantly sexual mode of transmission: Secondary | ICD-10-CM

## 2022-11-28 LAB — WET PREP FOR TRICH, YEAST, CLUE
Clue Cell Exam: NEGATIVE
Trichomonas Exam: NEGATIVE
Yeast Exam: NEGATIVE

## 2022-11-28 NOTE — Progress Notes (Signed)
Good Morning.  Your vaginal swab for Bacterial Vaginosis, yeast and trichomonas are negative.

## 2022-11-28 NOTE — Progress Notes (Signed)
BP 112/72   Pulse 98   Temp 98.5 F (36.9 C) (Oral)   Wt 130 lb 3.2 oz (59.1 kg)   LMP 11/16/2022   SpO2 99%   BMI 23.06 kg/m    Subjective:    Patient ID: Sherry Park, female    DOB: September 12, 1986, 37 y.o.   MRN: [id number]  HPI: Sherry Park is a 37 y.o. female  Chief Complaint  Patient presents with   STD Labs    Pt states she has recently found out that her partner has been unfaithful, requesting to have STD labs done   STD SCREENING Sexual activity:  has been in monogomous relationship but recently found out her significant other was unfaithful Contraception: no Recent unprotected intercourse: yes History of sexually transmitted diseases: no Previous sexually transmitted disease screening: no Lifetime sexual partners:  Genital lesions: no Penile discharge: no Dysuria: no Swollen lymph nodes: no Fevers: no Rash: no Denies dysuria and vaginal discharge  Relevant past medical, surgical, family and social history reviewed and updated as indicated. Interim medical history since our last visit reviewed. Allergies and medications reviewed and updated.  Review of Systems  Genitourinary:  Negative for dysuria and vaginal discharge.    Per HPI unless specifically indicated above     Objective:    BP 112/72   Pulse 98   Temp 98.5 F (36.9 C) (Oral)   Wt 130 lb 3.2 oz (59.1 kg)   LMP 11/16/2022   SpO2 99%   BMI 23.06 kg/m   Wt Readings from Last 3 Encounters:  11/28/22 130 lb 3.2 oz (59.1 kg)  11/24/22 130 lb (59 kg)  11/05/22 131 lb 8 oz (59.6 kg)    Physical Exam Vitals and nursing note reviewed.  Constitutional:      General: She is not in acute distress.    Appearance: Normal appearance. She is normal weight. She is not ill-appearing, toxic-appearing or diaphoretic.  HENT:     Head: Normocephalic.     Right Ear: External ear normal.     Left Ear: External ear normal.     Nose: Nose normal.     Mouth/Throat:     Mouth: Mucous membranes are  moist.     Pharynx: Oropharynx is clear.  Eyes:     General:        Right eye: No discharge.        Left eye: No discharge.     Extraocular Movements: Extraocular movements intact.     Conjunctiva/sclera: Conjunctivae normal.     Pupils: Pupils are equal, round, and reactive to light.  Cardiovascular:     Rate and Rhythm: Normal rate and regular rhythm.     Heart sounds: No murmur heard. Pulmonary:     Effort: Pulmonary effort is normal. No respiratory distress.     Breath sounds: Normal breath sounds. No wheezing or rales.  Musculoskeletal:     Cervical back: Normal range of motion and neck supple.  Skin:    General: Skin is warm and dry.     Capillary Refill: Capillary refill takes less than 2 seconds.  Neurological:     General: No focal deficit present.     Mental Status: She is alert and oriented to person, place, and time. Mental status is at baseline.  Psychiatric:        Mood and Affect: Mood normal.        Behavior: Behavior normal.        Thought Content:  Thought content normal.        Judgment: Judgment normal.     Results for orders placed or performed in visit on 08/08/22  Microscopic Examination   Urine  Result Value Ref Range   WBC, UA 0-5 0 - 5 /hpf   RBC, Urine 3-10 (A) 0 - 2 /hpf   Epithelial Cells (non renal) 0-10 0 - 10 /hpf   Bacteria, UA None seen None seen/Few  CBC with Differential/Platelet  Result Value Ref Range   WBC 4.8 3.4 - 10.8 x10E3/uL   RBC 4.46 3.77 - 5.28 x10E6/uL   Hemoglobin 13.7 11.1 - 15.9 g/dL   Hematocrit 42.1 34.0 - 46.6 %   MCV 94 79 - 97 fL   MCH 30.7 26.6 - 33.0 pg   MCHC 32.5 31.5 - 35.7 g/dL   RDW 12.2 11.7 - 15.4 %   Platelets 186 150 - 450 x10E3/uL   Neutrophils 62 Not Estab. %   Lymphs 26 Not Estab. %   Monocytes 8 Not Estab. %   Eos 3 Not Estab. %   Basos 1 Not Estab. %   Neutrophils Absolute 3.0 1.4 - 7.0 x10E3/uL   Lymphocytes Absolute 1.3 0.7 - 3.1 x10E3/uL   Monocytes Absolute 0.4 0.1 - 0.9 x10E3/uL   EOS  (ABSOLUTE) 0.1 0.0 - 0.4 x10E3/uL   Basophils Absolute 0.1 0.0 - 0.2 x10E3/uL   Immature Granulocytes 0 Not Estab. %   Immature Grans (Abs) 0.0 0.0 - 0.1 x10E3/uL  Comprehensive metabolic panel  Result Value Ref Range   Glucose 86 70 - 99 mg/dL   BUN 10 6 - 20 mg/dL   Creatinine, Ser 0.80 0.57 - 1.00 mg/dL   eGFR 99 >59 mL/min/1.73   BUN/Creatinine Ratio 13 9 - 23   Sodium 138 134 - 144 mmol/L   Potassium 3.6 3.5 - 5.2 mmol/L   Chloride 101 96 - 106 mmol/L   CO2 24 20 - 29 mmol/L   Calcium 9.3 8.7 - 10.2 mg/dL   Total Protein 7.3 6.0 - 8.5 g/dL   Albumin 4.6 3.9 - 4.9 g/dL   Globulin, Total 2.7 1.5 - 4.5 g/dL   Albumin/Globulin Ratio 1.7 1.2 - 2.2   Bilirubin Total 1.2 0.0 - 1.2 mg/dL   Alkaline Phosphatase 67 44 - 121 IU/L   AST 16 0 - 40 IU/L   ALT 9 0 - 32 IU/L  Lipid panel  Result Value Ref Range   Cholesterol, Total 146 100 - 199 mg/dL   Triglycerides 76 0 - 149 mg/dL   HDL 45 >39 mg/dL   VLDL Cholesterol Cal 15 5 - 40 mg/dL   LDL Chol Calc (NIH) 86 0 - 99 mg/dL   Chol/HDL Ratio 3.2 0.0 - 4.4 ratio  TSH  Result Value Ref Range   TSH 0.875 0.450 - 4.500 uIU/mL  Urinalysis, Routine w reflex microscopic  Result Value Ref Range   Specific Gravity, UA 1.025 1.005 - 1.030   pH, UA 6.5 5.0 - 7.5   Color, UA Yellow Yellow   Appearance Ur Clear Clear   Leukocytes,UA Negative Negative   Protein,UA 1+ (A) Negative/Trace   Glucose, UA Negative Negative   Ketones, UA Negative Negative   RBC, UA 3+ (A) Negative   Bilirubin, UA Negative Negative   Urobilinogen, Ur 0.2 0.2 - 1.0 mg/dL   Nitrite, UA Negative Negative   Microscopic Examination See below:       Assessment & Plan:   Problem List Items Addressed  This Visit   None Visit Diagnoses     Bleeding after intercourse    -  Primary   Will check STI screening.  Not currently having symptoms. Will make recommendations based on lab results.   Routine screening for STI (sexually transmitted infection)       Relevant  Orders   HIV Antibody (routine testing w rflx)   RPR   Hepatitis C antibody   Chlamydia/Gonococcus/Trichomonas, NAA   HSV 1 and 2 Ab, IgG   WET PREP FOR TRICH, YEAST, CLUE        Follow up plan: No follow-ups on file.

## 2022-12-02 DIAGNOSIS — F411 Generalized anxiety disorder: Secondary | ICD-10-CM | POA: Diagnosis not present

## 2022-12-02 LAB — RPR: RPR Ser Ql: NONREACTIVE

## 2022-12-02 LAB — HEPATITIS C ANTIBODY: Hep C Virus Ab: NONREACTIVE

## 2022-12-02 LAB — HSV 1 AND 2 AB, IGG
HSV 1 Glycoprotein G Ab, IgG: 60.3 index — ABNORMAL HIGH (ref 0.00–0.90)
HSV 2 IgG, Type Spec: <0.91 index (ref 0.00–0.90)

## 2022-12-02 LAB — HIV ANTIBODY (ROUTINE TESTING W REFLEX): HIV Screen 4th Generation wRfx: NONREACTIVE

## 2022-12-02 NOTE — Progress Notes (Signed)
Good Morning.  Your lab work shows that you do have oral herpes.  However, you do not have any STIs.  Please let me know if you have any questions.

## 2022-12-03 LAB — CHLAMYDIA/GONOCOCCUS/TRICHOMONAS, NAA
Chlamydia by NAA: NEGATIVE
Gonococcus by NAA: NEGATIVE
Trich vag by NAA: NEGATIVE

## 2022-12-03 NOTE — Progress Notes (Signed)
Good Morning.  Your STI screening was negative.

## 2023-01-02 DIAGNOSIS — F411 Generalized anxiety disorder: Secondary | ICD-10-CM | POA: Diagnosis not present

## 2023-01-30 DIAGNOSIS — F411 Generalized anxiety disorder: Secondary | ICD-10-CM | POA: Diagnosis not present

## 2023-02-13 DIAGNOSIS — E282 Polycystic ovarian syndrome: Secondary | ICD-10-CM | POA: Diagnosis not present

## 2023-02-13 DIAGNOSIS — D352 Benign neoplasm of pituitary gland: Secondary | ICD-10-CM | POA: Diagnosis not present

## 2023-02-20 DIAGNOSIS — L709 Acne, unspecified: Secondary | ICD-10-CM | POA: Diagnosis not present

## 2023-02-20 DIAGNOSIS — E282 Polycystic ovarian syndrome: Secondary | ICD-10-CM | POA: Diagnosis not present

## 2023-02-20 DIAGNOSIS — D352 Benign neoplasm of pituitary gland: Secondary | ICD-10-CM | POA: Diagnosis not present

## 2023-02-27 DIAGNOSIS — F411 Generalized anxiety disorder: Secondary | ICD-10-CM | POA: Diagnosis not present

## 2023-03-27 DIAGNOSIS — F411 Generalized anxiety disorder: Secondary | ICD-10-CM | POA: Diagnosis not present

## 2023-04-24 DIAGNOSIS — F411 Generalized anxiety disorder: Secondary | ICD-10-CM | POA: Diagnosis not present

## 2023-05-22 DIAGNOSIS — F411 Generalized anxiety disorder: Secondary | ICD-10-CM | POA: Diagnosis not present

## 2023-06-19 DIAGNOSIS — F411 Generalized anxiety disorder: Secondary | ICD-10-CM | POA: Diagnosis not present

## 2023-07-21 DIAGNOSIS — F411 Generalized anxiety disorder: Secondary | ICD-10-CM | POA: Diagnosis not present

## 2023-08-07 DIAGNOSIS — F411 Generalized anxiety disorder: Secondary | ICD-10-CM | POA: Diagnosis not present

## 2023-08-08 NOTE — Progress Notes (Unsigned)
There were no vitals taken for this visit.   Subjective:    Patient ID: Sherry Park, female    DOB: 09/09/1986, 37 y.o.   MRN: [id number]  HPI: Sherry Park is a 37 y.o. female presenting on 08/11/2023 for comprehensive medical examination. Current medical complaints include:none  She currently lives with: Menopausal Symptoms: no   Denies HA, CP, SOB, dizziness, palpitations, visual changes, and lower extremity swelling.  Depression Screen done today and results listed below:     11/28/2022    9:47 AM 08/08/2022    9:24 AM 09/24/2021    9:53 AM 08/07/2021    9:15 AM 05/21/2019    9:02 AM  Depression screen PHQ 2/9  Decreased Interest 0 0 0 0 0  Down, Depressed, Hopeless 0 0 0 0 0  PHQ - 2 Score 0 0 0 0 0  Altered sleeping 1 1 1  0 0  Tired, decreased energy 1 1 0 1 0  Change in appetite 0 0 0 0 0  Feeling bad or failure about yourself  0 0 0 0 0  Trouble concentrating 0 0 0 0 0  Moving slowly or fidgety/restless 0 0 0 0 0  Suicidal thoughts 0 0 0 0 0  PHQ-9 Score 2 2 1 1  0  Difficult doing work/chores Not difficult at all Not difficult at all Not difficult at all Not difficult at all Not difficult at all    The patient does not have a history of falls. I did complete a risk assessment for falls. A plan of care for falls was documented.   Past Medical History:  Past Medical History:  Diagnosis Date  . Anxiety   . Functional dyspepsia   . Headache    a. felt to be 2/2 Dostinex  . Hemorrhoids   . Hyperprolactinemia (HCC)   . Microprolactinoma (HCC)    a. on Dostinex; b. followed by Dr. Tedd Sias, MD  . Palpitations    a. 04/2016 Event Monitor: 1 episode of narrow complex tachycardia @ 163 - likely sinus tach though SVT could not be excluded (happened during "anxiety and panic attack"); b. 04/2016 Echo: EF 60-65%, no rwma. Nl LA size. Nl RV fxn.  Marland Kitchen PCOS (polycystic ovarian syndrome)     Surgical History:  Past Surgical History:  Procedure Laterality Date  .  ESOPHAGOGASTRODUODENOSCOPY (EGD) WITH PROPOFOL N/A 09/24/2018   Procedure: ESOPHAGOGASTRODUODENOSCOPY (EGD) WITH PROPOFOL;  Surgeon: Toney Reil, MD;  Location: [facility] ENDOSCOPY;  Service: Gastroenterology;  Laterality: N/A;  . NO PAST SURGERIES     verified with patient- 09/09/17    Medications:  Current Outpatient Medications on File Prior to Visit  Medication Sig  . amitriptyline (ELAVIL) 25 MG tablet Take 1 tablet (25 mg total) by mouth at bedtime.  . cabergoline (DOSTINEX) 0.5 MG tablet Take 0.25 mg by mouth once a week.  Marland Kitchen ERRIN 0.35 MG tablet Take 1 tablet by mouth daily.  . Multiple Vitamins-Minerals (MULTIVITAMIN ADULTS) TABS Take by mouth.   No current facility-administered medications on file prior to visit.    Allergies:  Allergies  Allergen Reactions  . Doxycycline Other (See Comments)    Thrush and rash Thrush and rash Thrush and rash  . Latex Itching  . Pantoprazole Other (See Comments)    abdominal pain abdominal pain abdominal pain    Social History:  Social History   Socioeconomic History  . Marital status: Single    Spouse name: Not on file  . Number  of children: Not on file  . Years of education: Not on file  . Highest education level: Not on file  Occupational History  . Not on file  Tobacco Use  . Smoking status: Never  . Smokeless tobacco: Never  Vaping Use  . Vaping status: Never Used  Substance and Sexual Activity  . Alcohol use: No  . Drug use: No  . Sexual activity: Yes    Birth control/protection: Pill  Other Topics Concern  . Not on file  Social History Narrative  . Not on file   Social Determinants of Health   Financial Resource Strain: Not on file  Food Insecurity: Not on file  Transportation Needs: Not on file  Physical Activity: Not on file  Stress: Not on file  Social Connections: Not on file  Intimate Partner Violence: Not on file   Social History   Tobacco Use  Smoking Status Never  Smokeless Tobacco Never    Social History   Substance and Sexual Activity  Alcohol Use No    Family History:  Family History  Problem Relation Age of Onset  . Diabetes Mellitus II Mother   . Hypertension Mother   . Stroke Mother        disabling stroke from oral contraceptive pill use at 35 years of age, no family hx of CVA  . Kidney disease Father   . Hypertension Sister   . Congestive Heart Failure Maternal Grandmother   . Kidney disease Maternal Grandmother   . Cancer Paternal Grandfather        skin  . Hematuria Neg Hx   . Kidney cancer Neg Hx   . Prostate cancer Neg Hx   . Sickle cell trait Neg Hx   . Tuberculosis Neg Hx     Past medical history, surgical history, medications, allergies, family history and social history reviewed with patient today and changes made to appropriate areas of the chart.   Review of Systems  Eyes:  Negative for blurred vision and double vision.  Respiratory:  Negative for shortness of breath.   Cardiovascular:  Negative for chest pain, palpitations and leg swelling.  Neurological:  Negative for dizziness and headaches.   All other ROS negative except what is listed above and in the HPI.      Objective:    There were no vitals taken for this visit.  Wt Readings from Last 3 Encounters:  11/28/22 130 lb 3.2 oz (59.1 kg)  11/24/22 130 lb (59 kg)  11/05/22 131 lb 8 oz (59.6 kg)    Physical Exam Vitals and nursing note reviewed.  Constitutional:      General: She is awake. She is not in acute distress.    Appearance: Normal appearance. She is well-developed and normal weight. She is not ill-appearing.  HENT:     Head: Normocephalic and atraumatic.     Right Ear: Hearing, tympanic membrane, ear canal and external ear normal. No drainage.     Left Ear: Hearing, tympanic membrane, ear canal and external ear normal. No drainage.     Nose: Nose normal.     Right Sinus: No maxillary sinus tenderness or frontal sinus tenderness.     Left Sinus: No maxillary sinus  tenderness or frontal sinus tenderness.     Mouth/Throat:     Mouth: Mucous membranes are moist.     Pharynx: Oropharynx is clear. Uvula midline. No pharyngeal swelling, oropharyngeal exudate or posterior oropharyngeal erythema.  Eyes:     General: Lids are  normal.        Right eye: No discharge.        Left eye: No discharge.     Extraocular Movements: Extraocular movements intact.     Conjunctiva/sclera: Conjunctivae normal.     Pupils: Pupils are equal, round, and reactive to light.     Visual Fields: Right eye visual fields normal and left eye visual fields normal.  Neck:     Thyroid: No thyromegaly.     Vascular: No carotid bruit.     Trachea: Trachea normal.  Cardiovascular:     Rate and Rhythm: Normal rate and regular rhythm.     Heart sounds: Normal heart sounds. No murmur heard.    No gallop.  Pulmonary:     Effort: Pulmonary effort is normal. No accessory muscle usage or respiratory distress.     Breath sounds: Normal breath sounds.  Chest:  Breasts:    Right: Normal.     Left: Normal.  Abdominal:     General: Bowel sounds are normal.     Palpations: Abdomen is soft. There is no hepatomegaly or splenomegaly.     Tenderness: There is no abdominal tenderness.  Musculoskeletal:        General: Normal range of motion.     Cervical back: Normal range of motion and neck supple.     Right lower leg: No edema.     Left lower leg: No edema.  Lymphadenopathy:     Head:     Right side of head: No submental, submandibular, tonsillar, preauricular or posterior auricular adenopathy.     Left side of head: No submental, submandibular, tonsillar, preauricular or posterior auricular adenopathy.     Cervical: No cervical adenopathy.     Upper Body:     Right upper body: No supraclavicular, axillary or pectoral adenopathy.     Left upper body: No supraclavicular, axillary or pectoral adenopathy.  Skin:    General: Skin is warm and dry.     Capillary Refill: Capillary refill takes  less than 2 seconds.     Findings: No rash.  Neurological:     Mental Status: She is alert and oriented to person, place, and time.     Gait: Gait is intact.     Deep Tendon Reflexes: Reflexes are normal and symmetric.     Reflex Scores:      Brachioradialis reflexes are 2+ on the right side and 2+ on the left side.      Patellar reflexes are 2+ on the right side and 2+ on the left side. Psychiatric:        Attention and Perception: Attention normal.        Mood and Affect: Mood normal.        Speech: Speech normal.        Behavior: Behavior normal. Behavior is cooperative.        Thought Content: Thought content normal.        Judgment: Judgment normal.    Results for orders placed or performed in visit on 11/28/22  Chlamydia/Gonococcus/Trichomonas, NAA   Specimen: Urine   UR  Result Value Ref Range   Chlamydia by NAA Negative Negative   Gonococcus by NAA Negative Negative   Trich vag by NAA Negative Negative  WET PREP FOR TRICH, YEAST, CLUE   Specimen: Urine   Urine  Result Value Ref Range   Trichomonas Exam Negative Negative   Yeast Exam Negative Negative   Clue Cell Exam Negative Negative  HIV Antibody (routine testing w rflx)  Result Value Ref Range   HIV Screen 4th Generation wRfx Non Reactive Non Reactive  RPR  Result Value Ref Range   RPR Ser Ql Non Reactive Non Reactive  Hepatitis C antibody  Result Value Ref Range   Hep C Virus Ab Non Reactive Non Reactive  HSV 1 and 2 Ab, IgG  Result Value Ref Range   HSV 1 Glycoprotein G Ab, IgG 60.30 (H) 0.00 - 0.90 index   HSV 2 IgG, Type Spec <0.91 0.00 - 0.90 index      Assessment & Plan:   Problem List Items Addressed This Visit       Other   Generalized anxiety disorder - Primary     Follow up plan: No follow-ups on file.   LABORATORY TESTING:  - Pap smear: up to date  IMMUNIZATIONS:   - Tdap: Tetanus vaccination status reviewed: last tetanus booster within 10 years. - Influenza: Administered  today - Pneumovax: Not applicable - Prevnar: Not applicable - HPV: Up to date - Zostavax vaccine: Not applicable  SCREENING: -Mammogram: Not applicable  - Colonoscopy: Not applicable  - Bone Density: Not applicable  -Hearing Test: Not applicable  -Spirometry: Not applicable   PATIENT COUNSELING:   Advised to take 1 mg of folate supplement per day if capable of pregnancy.   Sexuality: Discussed sexually transmitted diseases, partner selection, use of condoms, avoidance of unintended pregnancy  and contraceptive alternatives.   Advised to avoid cigarette smoking.  I discussed with the patient that most people either abstain from alcohol or drink within safe limits (<=14/week and <=4 drinks/occasion for males, <=7/weeks and <= 3 drinks/occasion for females) and that the risk for alcohol disorders and other health effects rises proportionally with the number of drinks per week and how often a drinker exceeds daily limits.  Discussed cessation/primary prevention of drug use and availability of treatment for abuse.   Diet: Encouraged to adjust caloric intake to maintain  or achieve ideal body weight, to reduce intake of dietary saturated fat and total fat, to limit sodium intake by avoiding high sodium foods and not adding table salt, and to maintain adequate dietary potassium and calcium preferably from fresh fruits, vegetables, and low-fat dairy products.    stressed the importance of regular exercise  Injury prevention: Discussed safety belts, safety helmets, smoke detector, smoking near bedding or upholstery.   Dental health: Discussed importance of regular tooth brushing, flossing, and dental visits.    NEXT PREVENTATIVE PHYSICAL DUE IN 1 YEAR. No follow-ups on file.

## 2023-08-11 ENCOUNTER — Encounter: Payer: Self-pay | Admitting: Nurse Practitioner

## 2023-08-11 ENCOUNTER — Ambulatory Visit (INDEPENDENT_AMBULATORY_CARE_PROVIDER_SITE_OTHER): Payer: 59 | Admitting: Nurse Practitioner

## 2023-08-11 VITALS — BP 112/77 | HR 105 | Temp 97.8°F | Ht 64.5 in | Wt 142.4 lb

## 2023-08-11 DIAGNOSIS — Z23 Encounter for immunization: Secondary | ICD-10-CM

## 2023-08-11 DIAGNOSIS — Z136 Encounter for screening for cardiovascular disorders: Secondary | ICD-10-CM | POA: Diagnosis not present

## 2023-08-11 DIAGNOSIS — F411 Generalized anxiety disorder: Secondary | ICD-10-CM

## 2023-08-11 DIAGNOSIS — Z0000 Encounter for general adult medical examination without abnormal findings: Secondary | ICD-10-CM

## 2023-08-11 LAB — URINALYSIS, ROUTINE W REFLEX MICROSCOPIC
Bilirubin, UA: NEGATIVE
Glucose, UA: NEGATIVE
Ketones, UA: NEGATIVE
Leukocytes,UA: NEGATIVE
Nitrite, UA: NEGATIVE
Specific Gravity, UA: 1.02 (ref 1.005–1.030)
Urobilinogen, Ur: 1 mg/dL (ref 0.2–1.0)
pH, UA: 7 (ref 5.0–7.5)

## 2023-08-11 LAB — MICROSCOPIC EXAMINATION
Bacteria, UA: NONE SEEN
WBC, UA: NONE SEEN /[HPF] (ref 0–5)

## 2023-08-12 LAB — COMPREHENSIVE METABOLIC PANEL
ALT: 10 [IU]/L (ref 0–32)
AST: 16 [IU]/L (ref 0–40)
Albumin: 4.3 g/dL (ref 3.9–4.9)
Alkaline Phosphatase: 49 [IU]/L (ref 44–121)
BUN/Creatinine Ratio: 18 (ref 9–23)
BUN: 13 mg/dL (ref 6–20)
Bilirubin Total: 0.8 mg/dL (ref 0.0–1.2)
CO2: 22 mmol/L (ref 20–29)
Calcium: 9.2 mg/dL (ref 8.7–10.2)
Chloride: 102 mmol/L (ref 96–106)
Creatinine, Ser: 0.72 mg/dL (ref 0.57–1.00)
Globulin, Total: 2.7 g/dL (ref 1.5–4.5)
Glucose: 84 mg/dL (ref 70–99)
Potassium: 4.1 mmol/L (ref 3.5–5.2)
Sodium: 139 mmol/L (ref 134–144)
Total Protein: 7 g/dL (ref 6.0–8.5)
eGFR: 112 mL/min/{1.73_m2} (ref >59)

## 2023-08-12 LAB — LIPID PANEL
Chol/HDL Ratio: 3 ratio (ref 0.0–4.4)
Cholesterol, Total: 139 mg/dL (ref 100–199)
HDL: 46 mg/dL (ref >39)
LDL Chol Calc (NIH): 78 mg/dL (ref 0–99)
Triglycerides: 74 mg/dL (ref 0–149)
VLDL Cholesterol Cal: 15 mg/dL (ref 5–40)

## 2023-08-12 LAB — CBC WITH DIFFERENTIAL/PLATELET
Basophils Absolute: 0 10*3/uL (ref 0.0–0.2)
Basos: 1 %
EOS (ABSOLUTE): 0.1 10*3/uL (ref 0.0–0.4)
Eos: 1 %
Hematocrit: 41.7 % (ref 34.0–46.6)
Hemoglobin: 13.9 g/dL (ref 11.1–15.9)
Immature Grans (Abs): 0 10*3/uL (ref 0.0–0.1)
Immature Granulocytes: 0 %
Lymphocytes Absolute: 1.7 10*3/uL (ref 0.7–3.1)
Lymphs: 35 %
MCH: 31.7 pg (ref 26.6–33.0)
MCHC: 33.3 g/dL (ref 31.5–35.7)
MCV: 95 fL (ref 79–97)
Monocytes Absolute: 0.3 10*3/uL (ref 0.1–0.9)
Monocytes: 7 %
Neutrophils Absolute: 2.7 10*3/uL (ref 1.4–7.0)
Neutrophils: 56 %
Platelets: 164 10*3/uL (ref 150–450)
RBC: 4.39 x10E6/uL (ref 3.77–5.28)
RDW: 11.7 % (ref 11.7–15.4)
WBC: 4.8 10*3/uL (ref 3.4–10.8)

## 2023-08-12 LAB — TSH: TSH: 1.21 u[IU]/mL (ref 0.450–4.500)

## 2023-08-18 DIAGNOSIS — D352 Benign neoplasm of pituitary gland: Secondary | ICD-10-CM | POA: Diagnosis not present

## 2023-08-18 DIAGNOSIS — E282 Polycystic ovarian syndrome: Secondary | ICD-10-CM | POA: Diagnosis not present

## 2023-09-01 DIAGNOSIS — D352 Benign neoplasm of pituitary gland: Secondary | ICD-10-CM | POA: Diagnosis not present

## 2023-09-01 DIAGNOSIS — E282 Polycystic ovarian syndrome: Secondary | ICD-10-CM | POA: Diagnosis not present

## 2023-09-12 DIAGNOSIS — F411 Generalized anxiety disorder: Secondary | ICD-10-CM | POA: Diagnosis not present

## 2023-10-16 DIAGNOSIS — F411 Generalized anxiety disorder: Secondary | ICD-10-CM | POA: Diagnosis not present

## 2023-11-25 DIAGNOSIS — F411 Generalized anxiety disorder: Secondary | ICD-10-CM | POA: Diagnosis not present

## 2023-11-29 ENCOUNTER — Other Ambulatory Visit: Payer: Self-pay | Admitting: Gastroenterology

## 2023-11-29 DIAGNOSIS — K30 Functional dyspepsia: Secondary | ICD-10-CM

## 2023-12-10 ENCOUNTER — Other Ambulatory Visit: Payer: Self-pay | Admitting: Gastroenterology

## 2023-12-10 DIAGNOSIS — K30 Functional dyspepsia: Secondary | ICD-10-CM

## 2023-12-10 MED ORDER — AMITRIPTYLINE HCL 25 MG PO TABS: 25.0000 mg | ORAL_TABLET | Freq: Every day | ORAL | 0 refills | Status: AC

## 2023-12-11 DIAGNOSIS — F411 Generalized anxiety disorder: Secondary | ICD-10-CM | POA: Diagnosis not present

## 2023-12-24 ENCOUNTER — Ambulatory Visit: Payer: 59 | Admitting: Gastroenterology

## 2023-12-31 DIAGNOSIS — Z01419 Encounter for gynecological examination (general) (routine) without abnormal findings: Secondary | ICD-10-CM | POA: Diagnosis not present

## 2023-12-31 LAB — HM PAP SMEAR: HPV Aptima: NEGATIVE

## 2023-12-31 LAB — RESULTS CONSOLE HPV: CHL HPV: NEGATIVE

## 2024-01-08 DIAGNOSIS — F411 Generalized anxiety disorder: Secondary | ICD-10-CM | POA: Diagnosis not present

## 2024-01-19 ENCOUNTER — Encounter: Payer: Self-pay | Admitting: Gastroenterology

## 2024-01-19 ENCOUNTER — Ambulatory Visit: Payer: 59 | Admitting: Gastroenterology

## 2024-01-19 VITALS — BP 130/86 | HR 99 | Temp 98.1°F | Ht 64.5 in | Wt 144.4 lb

## 2024-01-19 DIAGNOSIS — K30 Functional dyspepsia: Secondary | ICD-10-CM | POA: Diagnosis not present

## 2024-01-19 NOTE — Progress Notes (Unsigned)
 Arlyss Repress, MD [address]  [address]  [place], [place] [address]  Main: [phone]  Fax: [phone]    Gastroenterology Consultation  Referring Provider:     Larae Grooms, NP Primary Care Physician:  Larae Grooms, NP Primary Gastroenterologist:  Dr. Arlyss Repress Reason for Consultation:   Functional dyspepsia        HPI:   Sherry Park is a 38 y.o. female referred by Larae Grooms, NP  for consultation & management of functional dyspepsia.  Patient has been experiencing nausea, early satiety, bloating, burping since July 2019, got worse in August 2019.  Due to persistent symptoms, she was empirically treated for H. pylori infection with triple therapy based on positive serologies.  Her symptoms improved but continued to have early satiety associated with some bloating and burping.  She underwent gastric emptying study which was normal.  EGD was unremarkable.  I switched her from buspirone to amitriptyline 25 mg at bedtime 1 year ago.  Patient reports that her symptoms are significantly improved and has also helped to gain weight.  She does notice intermittent burping.  She is no longer experiencing early satiety and abdominal bloating.  Follow-up visit 11/05/2022 Patient is here for follow-up of functional dyspepsia.  She has been doing well on amitriptyline 12.5 mg daily.  She is here to renew her prescription, last visit was in 2022.  Patient is trying to lose weight by following healthy diet and exercise.  She does not have any GI concerns today.  Whenever she has flareup, increase his amitriptyline to 25 mg which helps  NSAIDs: None  Antiplts/Anticoagulants/Anti thrombotics: None  GI Procedures:   Nuclear medicine gastric emptying study 08/27/2020 Normal  EGD 09/24/2018 - Normal duodenal bulb and second portion of the duodenum. Biopsied. - A few gastric polyps. - Normal stomach. Biopsied. - Normal gastroesophageal junction and  esophagus. DIAGNOSIS:  A. DUODENUM; COLD BIOPSY:  - ENTERIC MUCOSA WITH PRESERVED VILLOUS ARCHITECTURE AND NO SIGNIFICANT  HISTOPATHOLOGIC CHANGE.  - NEGATIVE FOR FEATURES OF CELIAC, DYSPLASIA, AND MALIGNANCY.   B. STOMACH, RANDOM; COLD BIOPSY:  - OXYNTIC MUCOSA WITH MINIMAL CHRONIC GASTRITIS AND OXYNTIC GLAND  HYPERPLASIA, SEE NOTE.  - NEGATIVE FOR H. PYLORI, DYSPLASIA AND MALIGNANCY.  She denies family history of GI malignancy She does not smoke or drink alcohol Denies any GI surgeries  Past Medical History:  Diagnosis Date   Anxiety    Functional dyspepsia    Headache    a. felt to be 2/2 Dostinex   Hemorrhoids    Hyperprolactinemia (HCC)    Microprolactinoma (HCC)    a. on Dostinex; b. followed by Dr. Tedd Sias, MD   Palpitations    a. 04/2016 Event Monitor: 1 episode of narrow complex tachycardia @ 163 - likely sinus tach though SVT could not be excluded (happened during "anxiety and panic attack"); b. 04/2016 Echo: EF 60-65%, no rwma. Nl LA size. Nl RV fxn.   PCOS (polycystic ovarian syndrome)     Past Surgical History:  Procedure Laterality Date   ESOPHAGOGASTRODUODENOSCOPY (EGD) WITH PROPOFOL N/A 09/24/2018   Procedure: ESOPHAGOGASTRODUODENOSCOPY (EGD) WITH PROPOFOL;  Surgeon: Toney Reil, MD;  Location: [facility] ENDOSCOPY;  Service: Gastroenterology;  Laterality: N/A;   NO PAST SURGERIES     verified with patient- 09/09/17    Current Outpatient Medications:    amitriptyline (ELAVIL) 25 MG tablet, Take 1 tablet (25 mg total) by mouth at bedtime., Disp: 90 tablet, Rfl: 0   cabergoline (DOSTINEX) 0.5  MG tablet, Take 0.25 mg by mouth once a week., Disp: , Rfl:    ERRIN 0.35 MG tablet, Take 1 tablet by mouth daily., Disp: , Rfl: 3   Multiple Vitamins-Minerals (MULTIVITAMIN ADULTS) TABS, Take by mouth., Disp: , Rfl:    Family History  Problem Relation Age of Onset   Diabetes Mellitus II Mother    Hypertension Mother    Stroke Mother        disabling stroke from oral  contraceptive pill use at 36 years of age, no family hx of CVA   Kidney disease Father    Hypertension Sister    Congestive Heart Failure Maternal Grandmother    Kidney disease Maternal Grandmother    Cancer Paternal Grandfather        skin   Hematuria Neg Hx    Kidney cancer Neg Hx    Prostate cancer Neg Hx    Sickle cell trait Neg Hx    Tuberculosis Neg Hx      Social History   Tobacco Use   Smoking status: Never   Smokeless tobacco: Never  Vaping Use   Vaping status: Never Used  Substance Use Topics   Alcohol use: No   Drug use: No    Allergies as of 01/19/2024 - Review Complete 01/19/2024  Allergen Reaction Noted   Doxycycline Other (See Comments) 06/03/2017   Latex Itching 07/01/2016   Pantoprazole Other (See Comments) 07/10/2018    Review of Systems:    All systems reviewed and negative except where noted in HPI.   Physical Exam:  BP 130/86 (BP Location: Left Arm, Patient Position: Sitting, Cuff Size: Normal)   Pulse 99   Temp 98.1 F (36.7 C) (Oral)   Ht 5' 4.5" (1.638 m)   Wt 144 lb 6 oz (65.5 kg)   BMI 24.40 kg/m  No LMP recorded.  General:   Alert,  Well-developed, well-nourished, pleasant and cooperative in NAD Head:  Normocephalic and atraumatic. Eyes:  Sclera clear, no icterus.   Conjunctiva pink. Ears:  Normal auditory acuity. Nose:  No deformity, discharge, or lesions. Mouth:  No deformity or lesions,oropharynx pink & moist. Neck:  Supple; no masses or thyromegaly. Lungs:  Respirations even and unlabored.  Clear throughout to auscultation.   No wheezes, crackles, or rhonchi. No acute distress. Heart:  Regular rate and rhythm; no murmurs, clicks, rubs, or gallops. Abdomen:  Normal bowel sounds. Soft, non-tender and non-distended without masses, hepatosplenomegaly or hernias noted.  No guarding or rebound tenderness.   Rectal: Not performed Msk:  Symmetrical without gross deformities. Good, equal movement & strength bilaterally. Pulses:  Normal  pulses noted. Extremities:  No clubbing or edema.  No cyanosis. Neurologic:  Alert and oriented x3;  grossly normal neurologically. Skin:  Intact without significant lesions or rashes. No jaundice. Psych:  Alert and cooperative. Normal mood and affect.  Imaging Studies: Reviewed  Assessment and Plan:   NYLANI MICHETTI is a 38 y.o. Caucasian female with symptoms consistent with chronic nonulcer dyspepsia empirically treated with triple therapy for positive H. pylori serologies, modestly improved associated with significant weight loss. EGD with gastric and duodenal biopsies unremarkable, alpha gal panel, food allergy profile unremarkable, ferritin, iron and TIBC, total IgA and folate levels normal.  Patient was evaluated by general surgery who recommended HIDA scan to evaluate for biliary dyskinesia.  Patient deferred this test at that time as she figured out food triggers causing her symptoms.  She probably has functional dyspepsia.  She cannot tolerate carbohydrates  or any sugars.  Her main symptoms are postprandial abdominal bloating and nausea.  She also tried 2 weeks course of rifaximin with no benefit.  Low FODMAP diet did not help.  Desipramine did not help.  She tried buspirone which did not help as well.  Amitriptyline 25 mg at bedtime significantly helped with her symptoms of dyspepsia.  Currently, she is taking amitriptyline 12.5 mg at bedtime regularly and 25 mg during flareups New prescription sent   Follow up as needed, contact via my chart as needed   Karma Oz, MD

## 2024-02-16 DIAGNOSIS — D352 Benign neoplasm of pituitary gland: Secondary | ICD-10-CM | POA: Diagnosis not present

## 2024-02-26 DIAGNOSIS — F411 Generalized anxiety disorder: Secondary | ICD-10-CM | POA: Diagnosis not present

## 2024-03-18 DIAGNOSIS — F411 Generalized anxiety disorder: Secondary | ICD-10-CM | POA: Diagnosis not present

## 2024-03-21 ENCOUNTER — Other Ambulatory Visit: Payer: Self-pay | Admitting: Gastroenterology

## 2024-03-21 DIAGNOSIS — K30 Functional dyspepsia: Secondary | ICD-10-CM

## 2024-03-22 ENCOUNTER — Encounter: Payer: Self-pay | Admitting: Gastroenterology

## 2024-04-02 DIAGNOSIS — D352 Benign neoplasm of pituitary gland: Secondary | ICD-10-CM | POA: Diagnosis not present

## 2024-04-02 DIAGNOSIS — E282 Polycystic ovarian syndrome: Secondary | ICD-10-CM | POA: Diagnosis not present

## 2024-04-02 DIAGNOSIS — Z1331 Encounter for screening for depression: Secondary | ICD-10-CM | POA: Diagnosis not present

## 2024-04-16 DIAGNOSIS — F411 Generalized anxiety disorder: Secondary | ICD-10-CM | POA: Diagnosis not present

## 2024-05-13 DIAGNOSIS — F411 Generalized anxiety disorder: Secondary | ICD-10-CM | POA: Diagnosis not present

## 2024-06-10 DIAGNOSIS — F411 Generalized anxiety disorder: Secondary | ICD-10-CM | POA: Diagnosis not present

## 2024-08-12 ENCOUNTER — Encounter: Payer: Self-pay | Admitting: Nurse Practitioner

## 2024-08-12 ENCOUNTER — Ambulatory Visit (INDEPENDENT_AMBULATORY_CARE_PROVIDER_SITE_OTHER): Payer: Self-pay | Admitting: Nurse Practitioner

## 2024-08-12 VITALS — BP 112/75 | HR 98 | Temp 98.6°F | Ht 64.5 in | Wt 148.0 lb

## 2024-08-12 DIAGNOSIS — Z136 Encounter for screening for cardiovascular disorders: Secondary | ICD-10-CM | POA: Diagnosis not present

## 2024-08-12 DIAGNOSIS — Z0000 Encounter for general adult medical examination without abnormal findings: Secondary | ICD-10-CM | POA: Diagnosis not present

## 2024-08-12 DIAGNOSIS — Z23 Encounter for immunization: Secondary | ICD-10-CM | POA: Diagnosis not present

## 2024-08-12 NOTE — Progress Notes (Signed)
 BP 112/75   Pulse 98   Temp 98.6 F (37 C) (Oral)   Ht 5' 4.5 (1.638 m)   Wt 148 lb (67.1 kg)   LMP 08/08/2024   SpO2 98%   BMI 25.01 kg/m    Subjective:    Patient ID: Sherry Park, female    DOB: [birth year], 38 y.o.   MRN: [id number]  HPI: Sherry Park is a 38 y.o. female presenting on 08/12/2024 for comprehensive medical examination. Current medical complaints include:none  She currently lives with: Menopausal Symptoms: no   Denies HA, CP, SOB, dizziness, palpitations, visual changes, and lower extremity swelling.   Depression Screen done today and results listed below:     08/12/2024    8:04 AM 08/11/2023    8:54 AM 11/28/2022    9:47 AM 08/08/2022    9:24 AM 09/24/2021    9:53 AM  Depression screen PHQ 2/9  Decreased Interest 0 0 0 0 0  Down, Depressed, Hopeless 0 0 0 0 0  PHQ - 2 Score 0 0 0 0 0  Altered sleeping 1 1 1 1 1   Tired, decreased energy 0 1 1 1  0  Change in appetite 0 0 0 0 0  Feeling bad or failure about yourself  0 0 0 0 0  Trouble concentrating 0 0 0 0 0  Moving slowly or fidgety/restless 0 0 0 0 0  Suicidal thoughts 0 0 0 0 0  PHQ-9 Score 1 2 2 2 1   Difficult doing work/chores Not difficult at all Not difficult at all Not difficult at all Not difficult at all Not difficult at all    The patient does not have a history of falls. I did complete a risk assessment for falls. A plan of care for falls was documented.   Past Medical History:  Past Medical History:  Diagnosis Date   Anxiety    Functional dyspepsia    Headache    a. felt to be 2/2 Dostinex   Hemorrhoids    Hyperprolactinemia    Microprolactinoma (HCC)    a. on Dostinex; b. followed by Dr. Damian, MD   Palpitations    a. 04/2016 Event Monitor: 1 episode of narrow complex tachycardia @ 163 - likely sinus tach though SVT could not be excluded (happened during anxiety and panic attack); b. 04/2016 Echo: EF 60-65%, no rwma. Nl LA size. Nl RV fxn.   PCOS (polycystic ovarian  syndrome)     Surgical History:  Past Surgical History:  Procedure Laterality Date   ESOPHAGOGASTRODUODENOSCOPY (EGD) WITH PROPOFOL  N/A 09/24/2018   Procedure: ESOPHAGOGASTRODUODENOSCOPY (EGD) WITH PROPOFOL ;  Surgeon: Unk Corinn Skiff, MD;  Location: [facility] ENDOSCOPY;  Service: Gastroenterology;  Laterality: N/A;   NO PAST SURGERIES     verified with patient- 09/09/17    Medications:  Current Outpatient Medications on File Prior to Visit  Medication Sig   amitriptyline  (ELAVIL ) 25 MG tablet Take 1 tablet (25 mg total) by mouth at bedtime.   cabergoline (DOSTINEX) 0.5 MG tablet Take 0.25 mg by mouth once a week.   ERRIN 0.35 MG tablet Take 1 tablet by mouth daily.   Multiple Vitamins-Minerals (MULTIVITAMIN ADULTS) TABS Take by mouth.   No current facility-administered medications on file prior to visit.    Allergies:  Allergies  Allergen Reactions   Doxycycline Other (See Comments)    Thrush and rash Thrush and rash Thrush and rash   Latex Itching   Pantoprazole  Other (See Comments)  abdominal pain abdominal pain abdominal pain    Social History:  Social History   Socioeconomic History   Marital status: Single    Spouse name: Not on file   Number of children: Not on file   Years of education: Not on file   Highest education level: Not on file  Occupational History   Not on file  Tobacco Use   Smoking status: Never   Smokeless tobacco: Never  Vaping Use   Vaping status: Never Used  Substance and Sexual Activity   Alcohol use: No   Drug use: No   Sexual activity: Yes    Birth control/protection: Pill  Other Topics Concern   Not on file  Social History Narrative   Not on file   Social Drivers of Health   Financial Resource Strain: Low Risk  (08/12/2024)   Overall Financial Resource Strain (CARDIA)    Difficulty of Paying Living Expenses: Not hard at all  Food Insecurity: No Food Insecurity (08/12/2024)   Hunger Vital Sign    Worried About Running Out  of Food in the Last Year: Never true    Ran Out of Food in the Last Year: Never true  Transportation Needs: No Transportation Needs (08/12/2024)   PRAPARE - Administrator, Civil Service (Medical): No    Lack of Transportation (Non-Medical): No  Physical Activity: Inactive (08/12/2024)   Exercise Vital Sign    Days of Exercise per Week: 0 days    Minutes of Exercise per Session: 0 min  Stress: No Stress Concern Present (08/12/2024)   [employer] of Occupational Health - Occupational Stress Questionnaire    Feeling of Stress: Only a little  Social Connections: Socially Isolated (08/12/2024)   Social Connection and Isolation Panel    Frequency of Communication with Friends and Family: More than three times a week    Frequency of Social Gatherings with Friends and Family: Twice a week    Attends Religious Services: Never    Database Administrator or Organizations: No    Attends Banker Meetings: Never    Marital Status: Never married  Intimate Partner Violence: Not At Risk (08/12/2024)   Humiliation, Afraid, Rape, and Kick questionnaire    Fear of Current or Ex-Partner: No    Emotionally Abused: No    Physically Abused: No    Sexually Abused: No   Social History   Tobacco Use  Smoking Status Never  Smokeless Tobacco Never   Social History   Substance and Sexual Activity  Alcohol Use No    Family History:  Family History  Problem Relation Age of Onset   Diabetes Mellitus II Mother    Hypertension Mother    Stroke Mother        disabling stroke from oral contraceptive pill use at 36 years of age, no family hx of CVA   Kidney disease Father    Hypertension Sister    Congestive Heart Failure Maternal Grandmother    Kidney disease Maternal Grandmother    Cancer Paternal Grandfather        skin   Hematuria Neg Hx    Kidney cancer Neg Hx    Prostate cancer Neg Hx    Sickle cell trait Neg Hx    Tuberculosis Neg Hx     Past medical history,  surgical history, medications, allergies, family history and social history reviewed with patient today and changes made to appropriate areas of the chart.   Review of Systems  Eyes:  Negative for blurred vision and double vision.  Respiratory:  Negative for shortness of breath.   Cardiovascular:  Negative for chest pain, palpitations and leg swelling.  Neurological:  Negative for dizziness and headaches.   All other ROS negative except what is listed above and in the HPI.      Objective:    BP 112/75   Pulse 98   Temp 98.6 F (37 C) (Oral)   Ht 5' 4.5 (1.638 m)   Wt 148 lb (67.1 kg)   LMP 08/08/2024   SpO2 98%   BMI 25.01 kg/m   Wt Readings from Last 3 Encounters:  08/12/24 148 lb (67.1 kg)  01/19/24 144 lb 6 oz (65.5 kg)  08/11/23 142 lb 6.4 oz (64.6 kg)    Physical Exam Vitals and nursing note reviewed.  Constitutional:      General: She is awake. She is not in acute distress.    Appearance: Normal appearance. She is well-developed. She is not ill-appearing.  HENT:     Head: Normocephalic and atraumatic.     Right Ear: Hearing, tympanic membrane, ear canal and external ear normal. No drainage.     Left Ear: Hearing, tympanic membrane, ear canal and external ear normal. No drainage.     Nose: Nose normal.     Right Sinus: No maxillary sinus tenderness or frontal sinus tenderness.     Left Sinus: No maxillary sinus tenderness or frontal sinus tenderness.     Mouth/Throat:     Mouth: Mucous membranes are moist.     Pharynx: Oropharynx is clear. Uvula midline. No pharyngeal swelling, oropharyngeal exudate or posterior oropharyngeal erythema.  Eyes:     General: Lids are normal.        Right eye: No discharge.        Left eye: No discharge.     Extraocular Movements: Extraocular movements intact.     Conjunctiva/sclera: Conjunctivae normal.     Pupils: Pupils are equal, round, and reactive to light.     Visual Fields: Right eye visual fields normal and left eye visual  fields normal.  Neck:     Thyroid : No thyromegaly.     Vascular: No carotid bruit.     Trachea: Trachea normal.  Cardiovascular:     Rate and Rhythm: Normal rate and regular rhythm.     Heart sounds: Normal heart sounds. No murmur heard.    No gallop.  Pulmonary:     Effort: Pulmonary effort is normal. No accessory muscle usage or respiratory distress.     Breath sounds: Normal breath sounds.  Chest:  Breasts:    Right: Normal.     Left: Normal.  Abdominal:     General: Bowel sounds are normal.     Palpations: Abdomen is soft. There is no hepatomegaly or splenomegaly.     Tenderness: There is no abdominal tenderness.  Musculoskeletal:        General: Normal range of motion.     Cervical back: Normal range of motion and neck supple.     Right lower leg: No edema.     Left lower leg: No edema.  Lymphadenopathy:     Head:     Right side of head: No submental, submandibular, tonsillar, preauricular or posterior auricular adenopathy.     Left side of head: No submental, submandibular, tonsillar, preauricular or posterior auricular adenopathy.     Cervical: No cervical adenopathy.     Upper Body:     Right  upper body: No supraclavicular, axillary or pectoral adenopathy.     Left upper body: No supraclavicular, axillary or pectoral adenopathy.  Skin:    General: Skin is warm and dry.     Capillary Refill: Capillary refill takes less than 2 seconds.     Findings: No rash.  Neurological:     Mental Status: She is alert and oriented to person, place, and time.     Gait: Gait is intact.  Psychiatric:        Attention and Perception: Attention normal.        Mood and Affect: Mood normal.        Speech: Speech normal.        Behavior: Behavior normal. Behavior is cooperative.        Thought Content: Thought content normal.        Judgment: Judgment normal.     Results for orders placed or performed in visit on 08/11/23  Microscopic Examination   Collection Time: 08/11/23  9:05  AM   Urine  Result Value Ref Range   WBC, UA None seen 0 - 5 /hpf   RBC, Urine 11-30 (A) 0 - 2 /hpf   Epithelial Cells (non renal) 0-10 0 - 10 /hpf   Mucus, UA Present (A) Not Estab.   Bacteria, UA None seen None seen/Few  Urinalysis, Routine w reflex microscopic   Collection Time: 08/11/23  9:05 AM  Result Value Ref Range   Specific Gravity, UA 1.020 1.005 - 1.030   pH, UA 7.0 5.0 - 7.5   Color, UA Yellow Yellow   Appearance Ur Clear Clear   Leukocytes,UA Negative Negative   Protein,UA Trace (A) Negative/Trace   Glucose, UA Negative Negative   Ketones, UA Negative Negative   RBC, UA 3+ (A) Negative   Bilirubin, UA Negative Negative   Urobilinogen, Ur 1.0 0.2 - 1.0 mg/dL   Nitrite, UA Negative Negative   Microscopic Examination See below:   CBC with Differential/Platelet   Collection Time: 08/11/23  9:06 AM  Result Value Ref Range   WBC 4.8 3.4 - 10.8 x10E3/uL   RBC 4.39 3.77 - 5.28 x10E6/uL   Hemoglobin 13.9 11.1 - 15.9 g/dL   Hematocrit 58.2 65.9 - 46.6 %   MCV 95 79 - 97 fL   MCH 31.7 26.6 - 33.0 pg   MCHC 33.3 31.5 - 35.7 g/dL   RDW 88.2 88.2 - 84.5 %   Platelets 164 150 - 450 x10E3/uL   Neutrophils 56 Not Estab. %   Lymphs 35 Not Estab. %   Monocytes 7 Not Estab. %   Eos 1 Not Estab. %   Basos 1 Not Estab. %   Neutrophils Absolute 2.7 1.4 - 7.0 x10E3/uL   Lymphocytes Absolute 1.7 0.7 - 3.1 x10E3/uL   Monocytes Absolute 0.3 0.1 - 0.9 x10E3/uL   EOS (ABSOLUTE) 0.1 0.0 - 0.4 x10E3/uL   Basophils Absolute 0.0 0.0 - 0.2 x10E3/uL   Immature Granulocytes 0 Not Estab. %   Immature Grans (Abs) 0.0 0.0 - 0.1 x10E3/uL  Comprehensive metabolic panel   Collection Time: 08/11/23  9:06 AM  Result Value Ref Range   Glucose 84 70 - 99 mg/dL   BUN 13 6 - 20 mg/dL   Creatinine, Ser 9.27 0.57 - 1.00 mg/dL   eGFR 887 >40 fO/fpw/8.26   BUN/Creatinine Ratio 18 9 - 23   Sodium 139 134 - 144 mmol/L   Potassium 4.1 3.5 - 5.2 mmol/L   Chloride 102 96 -  106 mmol/L   CO2 22 20 - 29  mmol/L   Calcium 9.2 8.7 - 10.2 mg/dL   Total Protein 7.0 6.0 - 8.5 g/dL   Albumin 4.3 3.9 - 4.9 g/dL   Globulin, Total 2.7 1.5 - 4.5 g/dL   Bilirubin Total 0.8 0.0 - 1.2 mg/dL   Alkaline Phosphatase 49 44 - 121 IU/L   AST 16 0 - 40 IU/L   ALT 10 0 - 32 IU/L  Lipid panel   Collection Time: 08/11/23  9:06 AM  Result Value Ref Range   Cholesterol, Total 139 100 - 199 mg/dL   Triglycerides 74 0 - 149 mg/dL   HDL 46 >60 mg/dL   VLDL Cholesterol Cal 15 5 - 40 mg/dL   LDL Chol Calc (NIH) 78 0 - 99 mg/dL   Chol/HDL Ratio 3.0 0.0 - 4.4 ratio  TSH   Collection Time: 08/11/23  9:06 AM  Result Value Ref Range   TSH 1.210 0.450 - 4.500 uIU/mL      Assessment & Plan:   Problem List Items Addressed This Visit   None Visit Diagnoses       Annual physical exam    -  Primary   Health maintenance reviewed during visit today.  Labs ordered.  Vaccines reviewed.  PAP up to date.   Relevant Orders   CBC with Differential/Platelet   Comprehensive metabolic panel with GFR   Lipid panel   TSH     Screening for ischemic heart disease       Relevant Orders   Lipid panel     Need for influenza vaccination       Relevant Orders   Flu vaccine trivalent PF, 6mos and older(Flulaval,Afluria,Fluarix,Fluzone) (Completed)        Follow up plan: Return in about 1 year (around 08/12/2025) for Physical and Fasting labs.   LABORATORY TESTING:  - Pap smear: up to date  IMMUNIZATIONS:   - Tdap: Tetanus vaccination status reviewed: last tetanus booster within 10 years. - Influenza: Administered today - Pneumovax: Not applicable - Prevnar: Not applicable - COVID: Refused - HPV: Not applicable - Shingrix vaccine: Not applicable  SCREENING: -Mammogram: Not applicable  - Colonoscopy: Not applicable  - Bone Density: Not applicable  -Hearing Test: Not applicable  -Spirometry: Not applicable   PATIENT COUNSELING:   Advised to take 1 mg of folate supplement per day if capable of pregnancy.    Sexuality: Discussed sexually transmitted diseases, partner selection, use of condoms, avoidance of unintended pregnancy  and contraceptive alternatives.   Advised to avoid cigarette smoking.  I discussed with the patient that most people either abstain from alcohol or drink within safe limits (<=14/week and <=4 drinks/occasion for males, <=7/weeks and <= 3 drinks/occasion for females) and that the risk for alcohol disorders and other health effects rises proportionally with the number of drinks per week and how often a drinker exceeds daily limits.  Discussed cessation/primary prevention of drug use and availability of treatment for abuse.   Diet: Encouraged to adjust caloric intake to maintain  or achieve ideal body weight, to reduce intake of dietary saturated fat and total fat, to limit sodium intake by avoiding high sodium foods and not adding table salt, and to maintain adequate dietary potassium and calcium preferably from fresh fruits, vegetables, and low-fat dairy products.    stressed the importance of regular exercise  Injury prevention: Discussed safety belts, safety helmets, smoke detector, smoking near bedding or upholstery.   Dental health: Discussed  importance of regular tooth brushing, flossing, and dental visits.    NEXT PREVENTATIVE PHYSICAL DUE IN 1 YEAR. Return in about 1 year (around 08/12/2025) for Physical and Fasting labs.

## 2024-08-13 ENCOUNTER — Ambulatory Visit: Payer: Self-pay | Admitting: Nurse Practitioner

## 2024-08-13 LAB — CBC WITH DIFFERENTIAL/PLATELET
Basophils Absolute: 0 x10E3/uL (ref 0.0–0.2)
Basos: 1 %
EOS (ABSOLUTE): 0.1 x10E3/uL (ref 0.0–0.4)
Eos: 2 %
Hematocrit: 42 % (ref 34.0–46.6)
Hemoglobin: 13.2 g/dL (ref 11.1–15.9)
Immature Grans (Abs): 0 x10E3/uL (ref 0.0–0.1)
Immature Granulocytes: 0 %
Lymphocytes Absolute: 1.5 x10E3/uL (ref 0.7–3.1)
Lymphs: 33 %
MCH: 30.6 pg (ref 26.6–33.0)
MCHC: 31.4 g/dL — ABNORMAL LOW (ref 31.5–35.7)
MCV: 97 fL (ref 79–97)
Monocytes Absolute: 0.4 x10E3/uL (ref 0.1–0.9)
Monocytes: 9 %
Neutrophils Absolute: 2.5 x10E3/uL (ref 1.4–7.0)
Neutrophils: 55 %
Platelets: 180 x10E3/uL (ref 150–450)
RBC: 4.31 x10E6/uL (ref 3.77–5.28)
RDW: 12.2 % (ref 11.7–15.4)
WBC: 4.6 x10E3/uL (ref 3.4–10.8)

## 2024-08-13 LAB — TSH: TSH: 0.927 u[IU]/mL (ref 0.450–4.500)

## 2024-08-13 LAB — COMPREHENSIVE METABOLIC PANEL WITH GFR
ALT: 9 IU/L (ref 0–32)
AST: 13 IU/L (ref 0–40)
Albumin: 4.3 g/dL (ref 3.9–4.9)
Alkaline Phosphatase: 51 IU/L (ref 41–116)
BUN/Creatinine Ratio: 19 (ref 9–23)
BUN: 15 mg/dL (ref 6–20)
Bilirubin Total: 0.6 mg/dL (ref 0.0–1.2)
CO2: 22 mmol/L (ref 20–29)
Calcium: 9.2 mg/dL (ref 8.7–10.2)
Chloride: 104 mmol/L (ref 96–106)
Creatinine, Ser: 0.8 mg/dL (ref 0.57–1.00)
Globulin, Total: 2.6 g/dL (ref 1.5–4.5)
Glucose: 93 mg/dL (ref 70–99)
Potassium: 4.2 mmol/L (ref 3.5–5.2)
Sodium: 140 mmol/L (ref 134–144)
Total Protein: 6.9 g/dL (ref 6.0–8.5)
eGFR: 98 mL/min/1.73 (ref >59)

## 2024-08-13 LAB — LIPID PANEL
Chol/HDL Ratio: 3.5 ratio (ref 0.0–4.4)
Cholesterol, Total: 150 mg/dL (ref 100–199)
HDL: 43 mg/dL (ref >39)
LDL Chol Calc (NIH): 93 mg/dL (ref 0–99)
Triglycerides: 73 mg/dL (ref 0–149)
VLDL Cholesterol Cal: 14 mg/dL (ref 5–40)

## 2024-08-19 DIAGNOSIS — F411 Generalized anxiety disorder: Secondary | ICD-10-CM | POA: Diagnosis not present

## 2024-09-23 DIAGNOSIS — F411 Generalized anxiety disorder: Secondary | ICD-10-CM | POA: Diagnosis not present

## 2024-09-27 DIAGNOSIS — D352 Benign neoplasm of pituitary gland: Secondary | ICD-10-CM | POA: Diagnosis not present

## 2025-08-15 ENCOUNTER — Encounter: Admitting: Nurse Practitioner
